# Patient Record
Sex: Female | Born: 1975 | Race: White | Hispanic: No | Smoking: Never smoker
Health system: Southern US, Academic
[De-identification: ages and names within clinical notes are randomized; demographics above are authoritative.]

## PROBLEM LIST (undated history)

## (undated) DIAGNOSIS — I10 Essential (primary) hypertension: Secondary | ICD-10-CM

---

## 2009-05-23 ENCOUNTER — Inpatient Hospital Stay (HOSPITAL_COMMUNITY): Payer: Self-pay | Admitting: Obstetrics & Gynecology

## 2021-11-06 IMAGING — MR MRI SHOULDER LT W/O CONTRAST
6 series · 39 of 40 positions shown · IV contrast (gadolinium)
Comparison: None available.

﻿EXAM:  [id number]   MRI SHOULDER LT W/O CONTRAST
INDICATION: Pain.
TECHNIQUE: Multiplanar multisequential MRI of the left shoulder joint was performed without gadolinium contrast.

[Series 6: T1 · oblique · left · 3.5mm · 0.33mm/px · 7 of 18 slices shown]
[im 1/18]
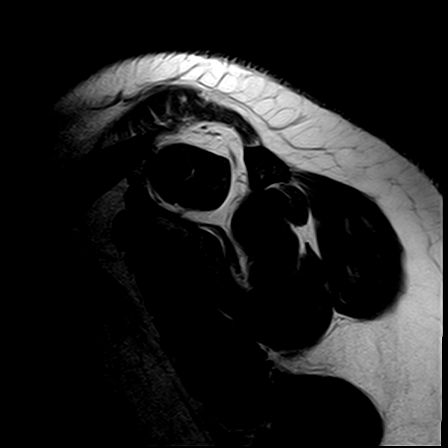
[im 3/18]
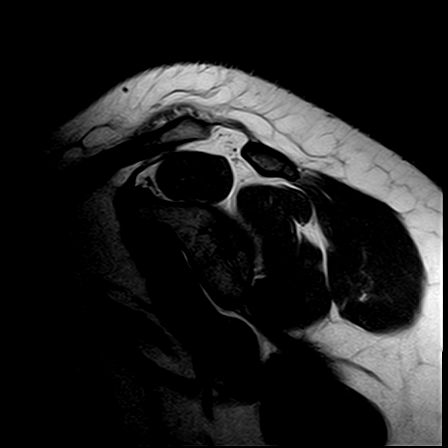
[im 6/18]
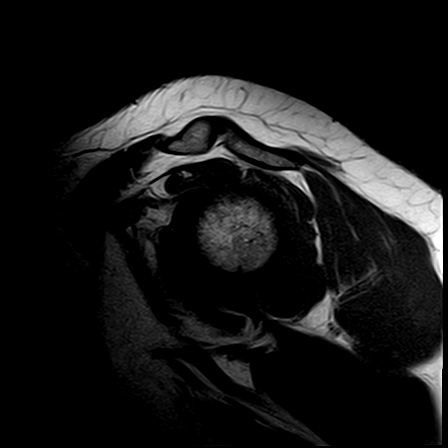
[im 9/18]
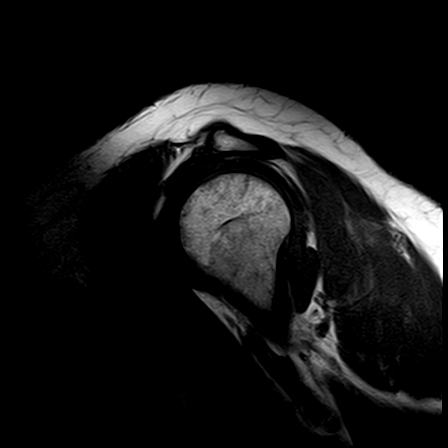
[im 12/18]
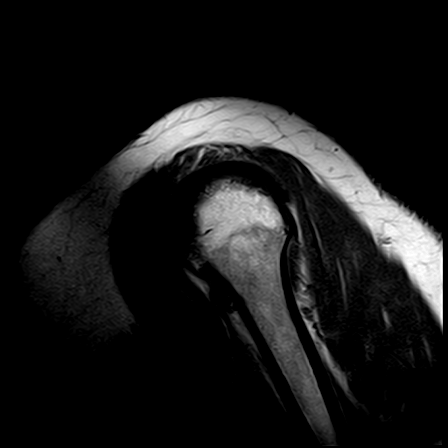
[im 15/18]
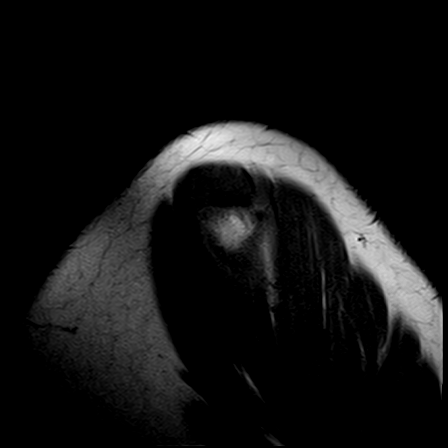
[im 18/18]
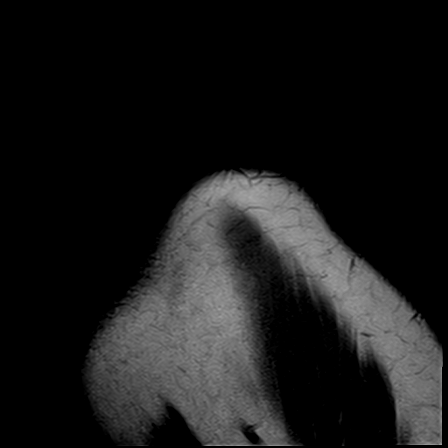

[Series 7: STIR · oblique · left · 3.5mm · 0.47mm/px · 7 of 18 slices shown (1 of 2)]
[im 1/18]
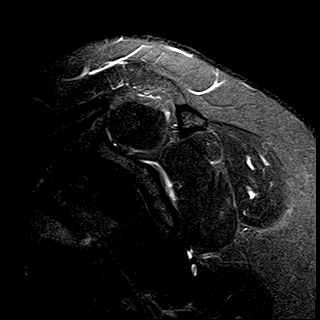
[im 3/18]
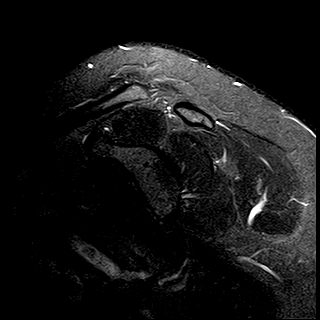
[im 6/18]
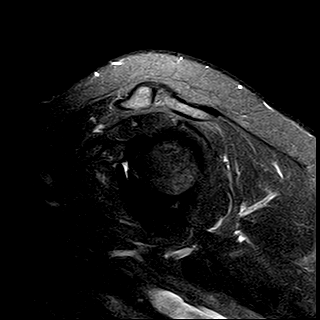
[im 9/18]
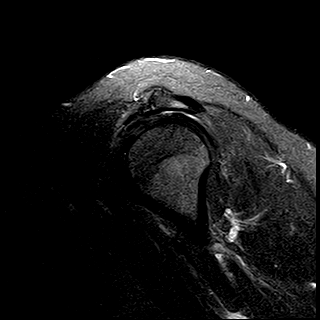
[im 12/18]
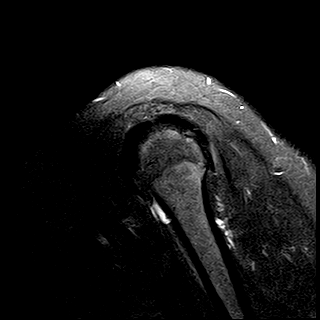
[im 15/18]
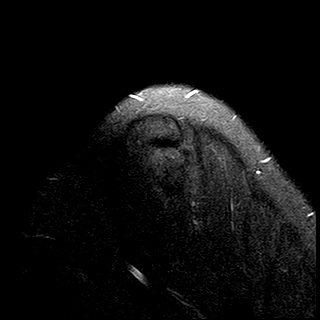
[im 18/18]
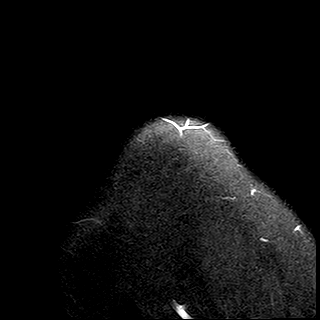

[Series 8: PD fat-sat · axial · left · 4.0mm · 0.50mm/px · z∈[-55,+22]mm · 6 of 18 slices shown (1 of 2)]
[im 1/18]
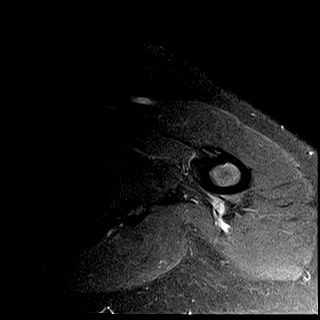
[im 4/18]
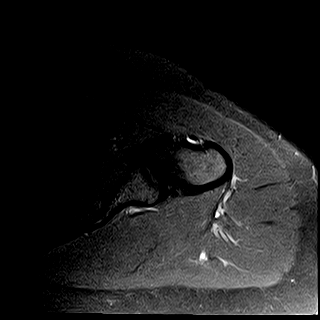
[im 7/18]
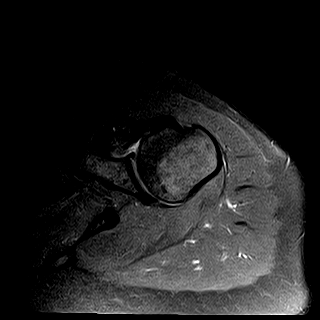
[im 11/18]
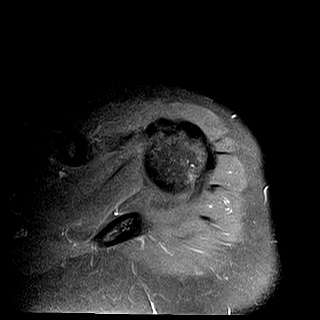
[im 14/18]
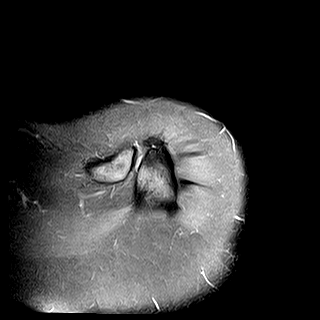
[im 18/18]
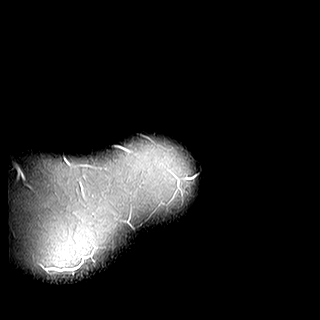

[Series 9: T2 fat-sat · axial · left · 4.0mm · 0.42mm/px · z∈[-68,+35]mm · 8 of 24 slices shown]
[im 1/24]
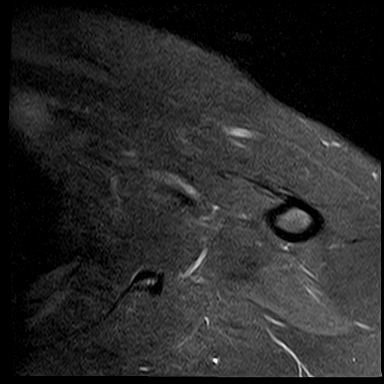
[im 4/24]
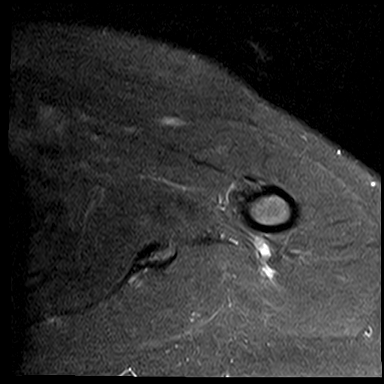
[im 7/24]
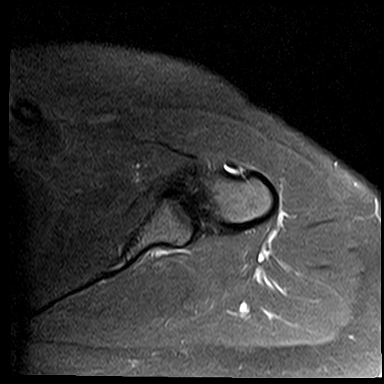
[im 10/24]
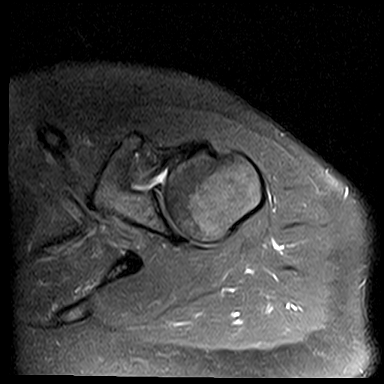
[im 14/24]
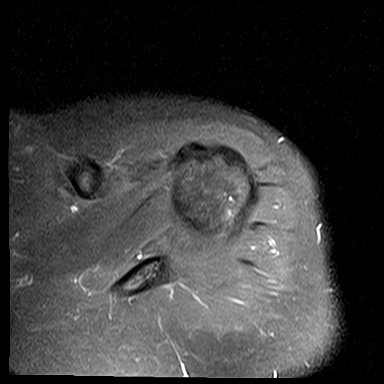
[im 17/24]
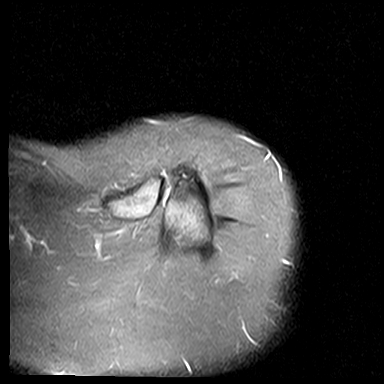
[im 20/24]
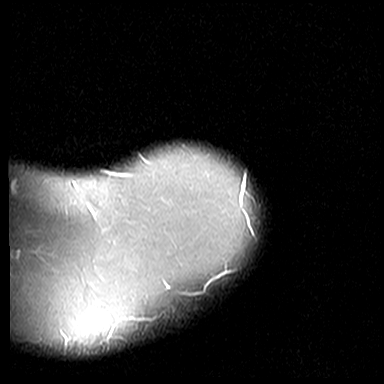
[im 24/24]
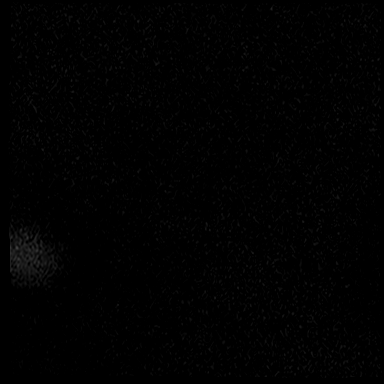

[Series 10: PD fat-sat · oblique · left · 3.5mm · 0.47mm/px · 6 of 18 slices shown (2 of 2)]
[im 1/18]
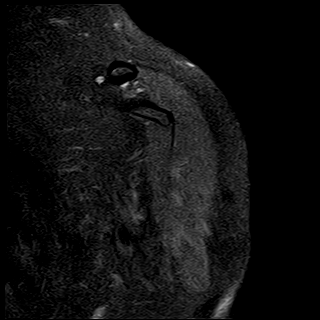
[im 4/18]
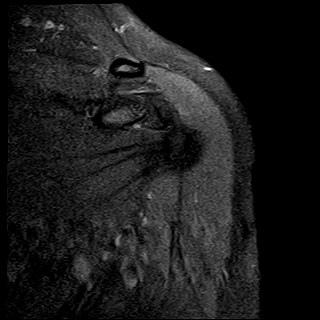
[im 7/18]
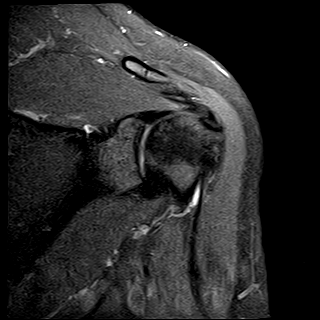
[im 11/18]
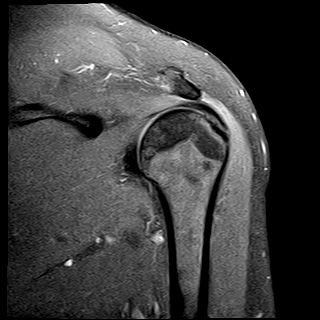
[im 14/18]
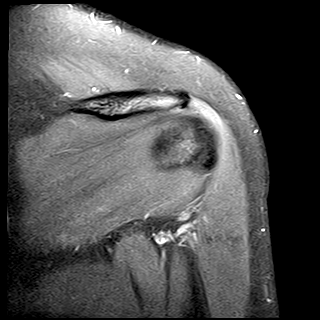
[im 18/18]
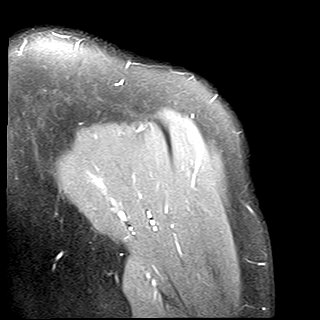

[Series 11: STIR · oblique · left · 3.5mm · 0.47mm/px · 5 of 18 slices shown (2 of 2)]
[im 1/18]
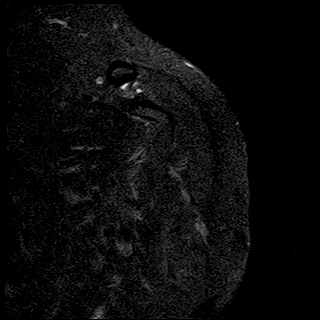
[im 4/18]
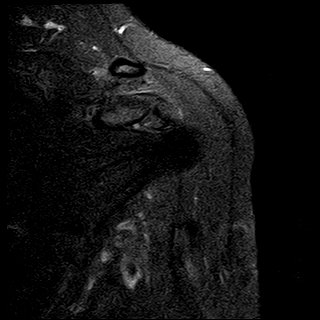
[im 7/18]
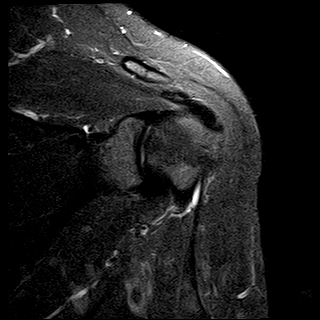
[im 11/18]
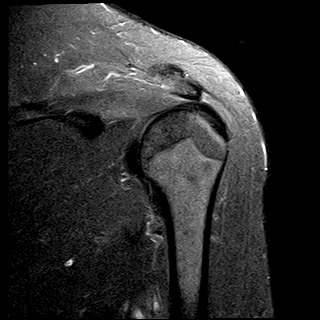
[im 14/18]
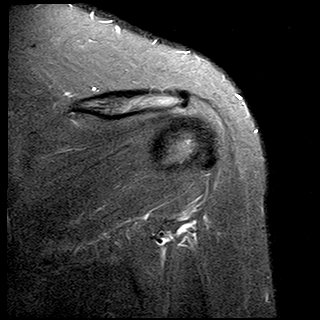

[39 of 40 positions shown; findings below may reference images not displayed]

FINDINGS: Supraspinatus, infraspinatus, teres minor and subscapularis muscles and tendons are within normal limits in morphology and signal intensity. Long head of biceps tendon is also well seated within the intertubercular groove and attaches normally to the biceps anchor. Superior labrum is intact. Glenohumeral articular cartilage is well maintained. Acromioclavicular joint is unremarkable.  No significant fluid is noted within the subacromial/subdeltoid bursa. Muscle bulk and bone marrow signal intensity are normal. No mass is seen along the course of the suprascapular nerve, within the spinoglenoid notch or within the quadrilateral space.
IMPRESSION: Unremarkable exam.

## 2022-01-24 IMAGING — MR MRI SHOUDLER LT W WO CONTRAST
4 of 6 series · 23 of 40 positions shown · IV contrast (Gadavist)
Comparison: Noncontrast MRI dated 11/06/2021.

﻿EXAM:  [id number]   MRI SHOUDLER LT W WO CONTRAST
INDICATION: Pain and decreased range of motion.
TECHNIQUE: Axial, sagittal and coronal T1 fat sat sequences were obtained without and with 5 mL of Gadavist.

[Series 8: T1 · oblique · left · 3.5mm · 0.31mm/px · 7 of 22 slices shown]
[im 1/22]
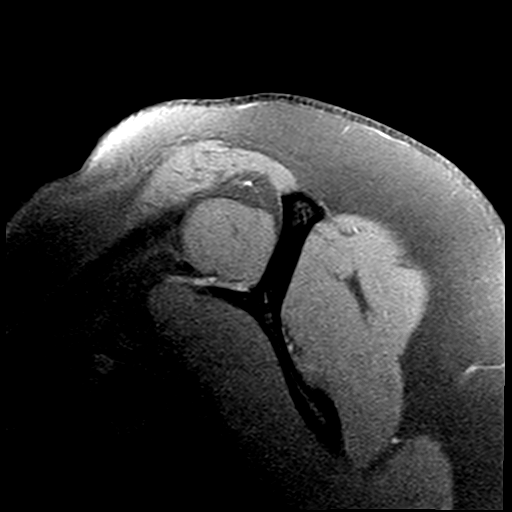
[im 4/22]
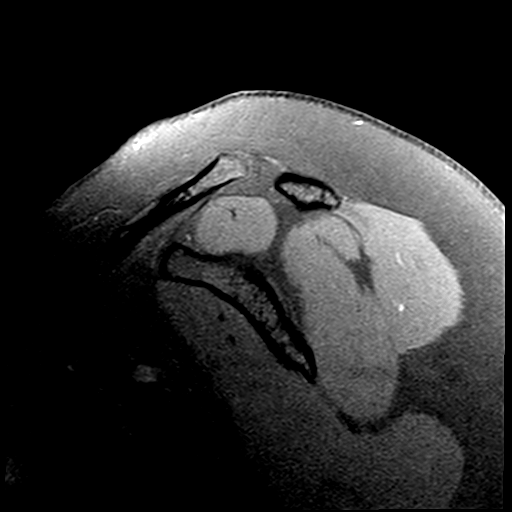
[im 8/22]
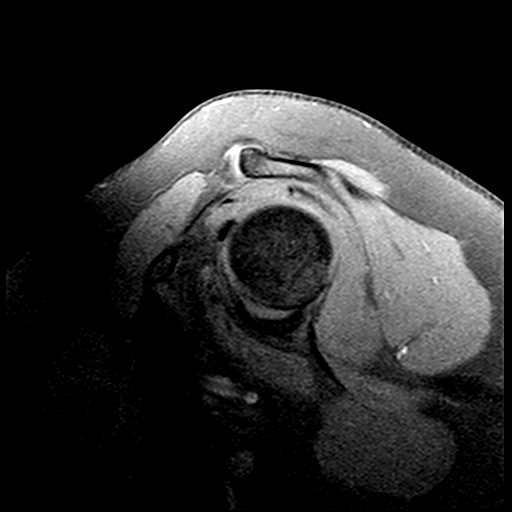
[im 11/22]
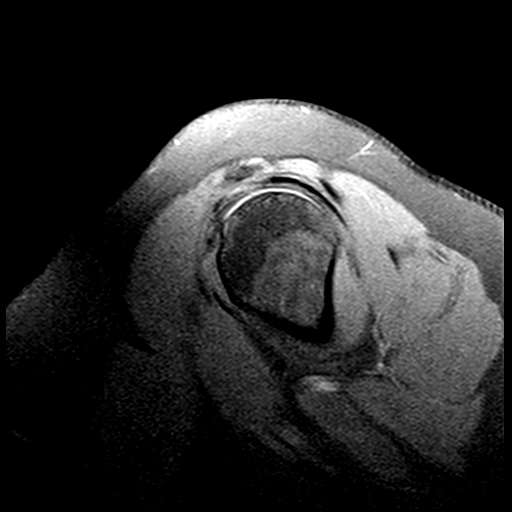
[im 15/22]
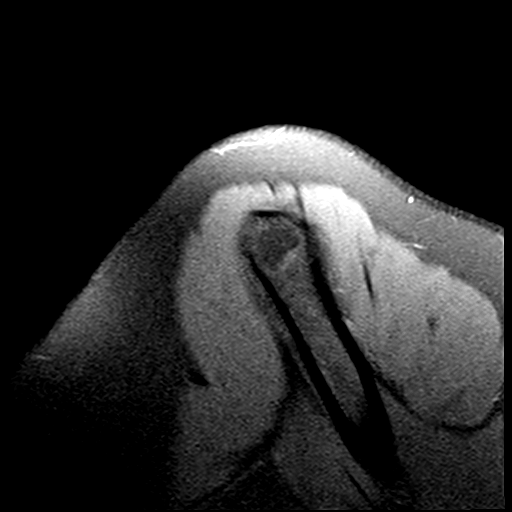
[im 18/22]
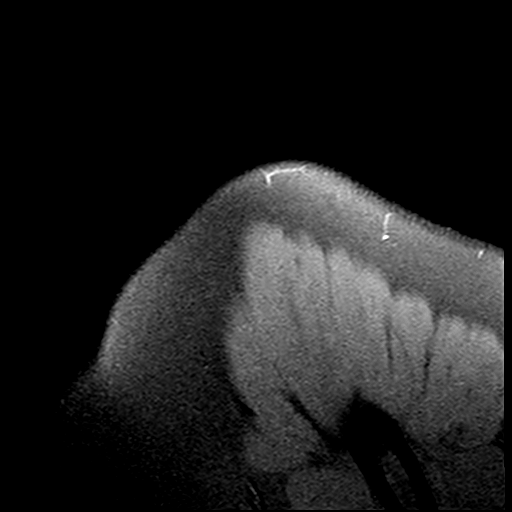
[im 22/22]
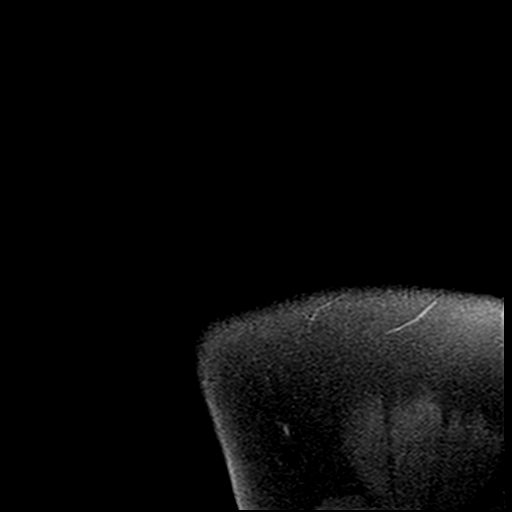

[Series 9: T1 fat-sat · axial · left · 4.0mm · 0.50mm/px · z∈[-70,+14]mm · 6 of 18 slices shown (1 of 2)]
[im 1/18]
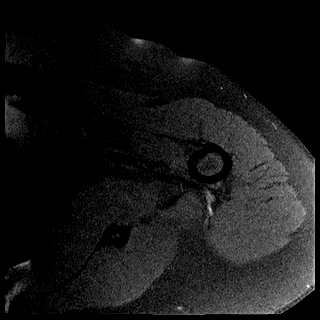
[im 4/18]
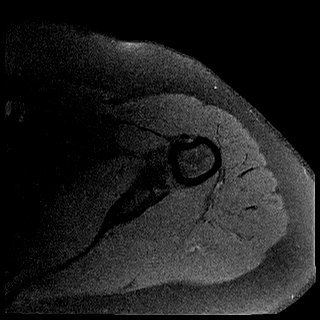
[im 7/18]
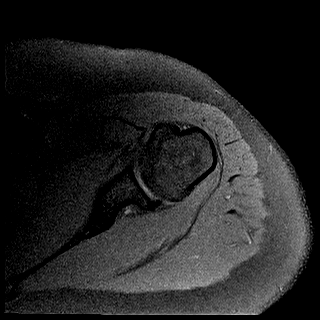
[im 11/18]
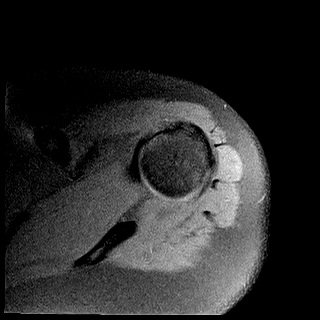
[im 14/18]
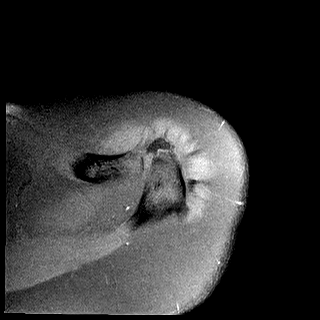
[im 18/18]
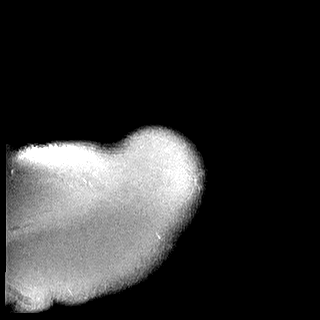

[Series 10: T1 fat-sat · coronal · left · 3.5mm · 0.31mm/px · 7 of 20 slices shown (2 of 2)]
[im 1/20]
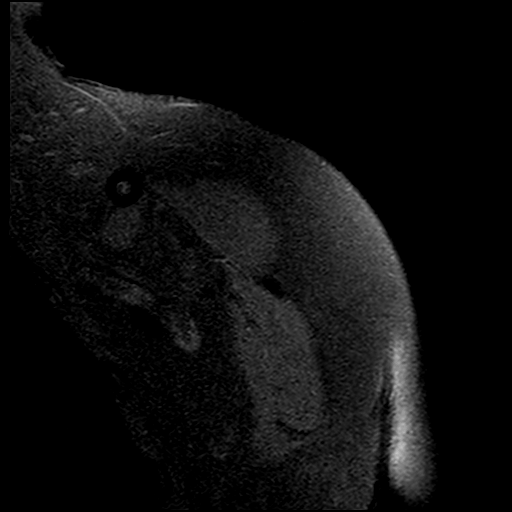
[im 4/20]
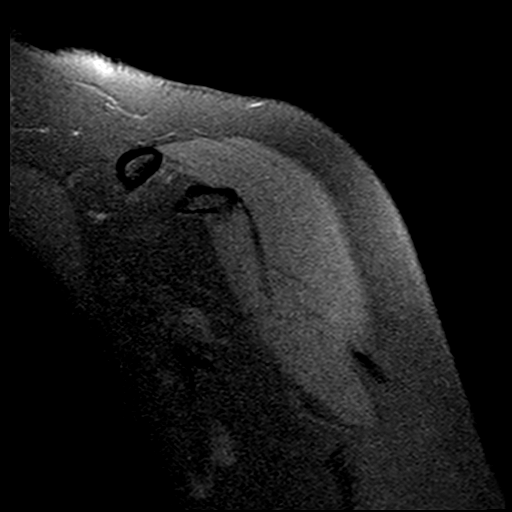
[im 7/20]
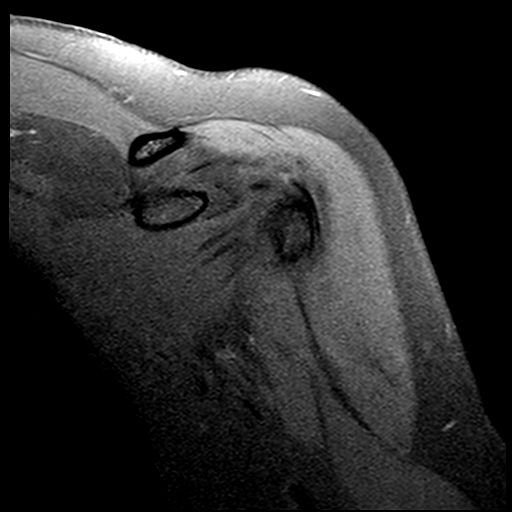
[im 10/20]
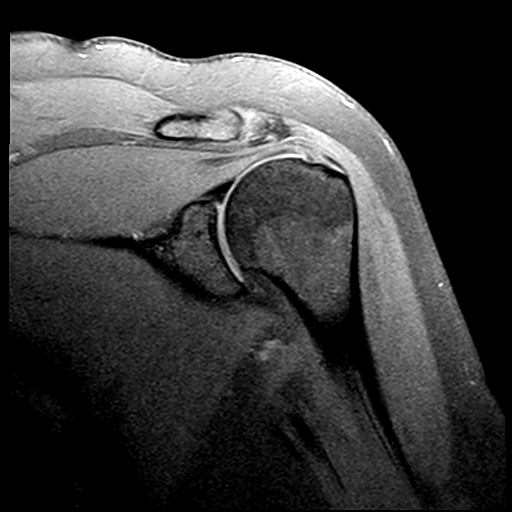
[im 13/20]
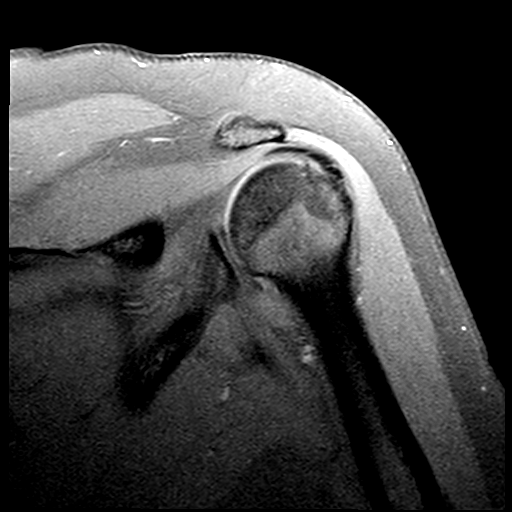
[im 16/20]
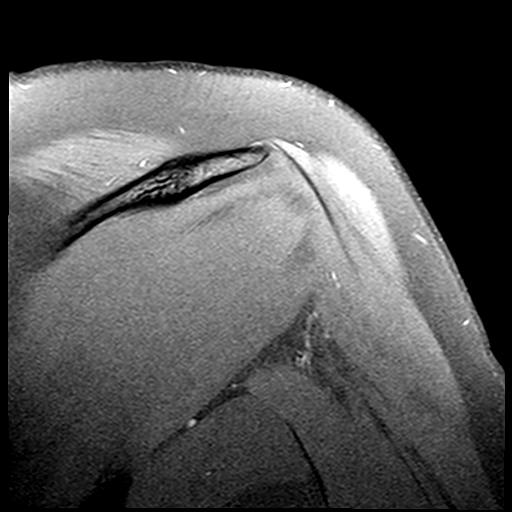
[im 20/20]
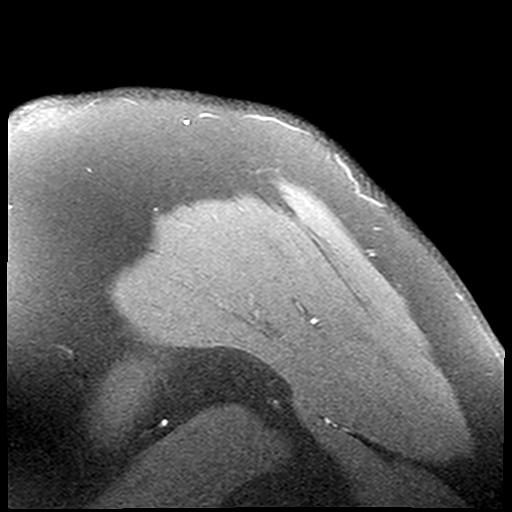

[Series 11: T1 fat-sat post-contrast · oblique · left · 3.5mm · 0.31mm/px · 3 of 22 slices shown]
[im 4/22]
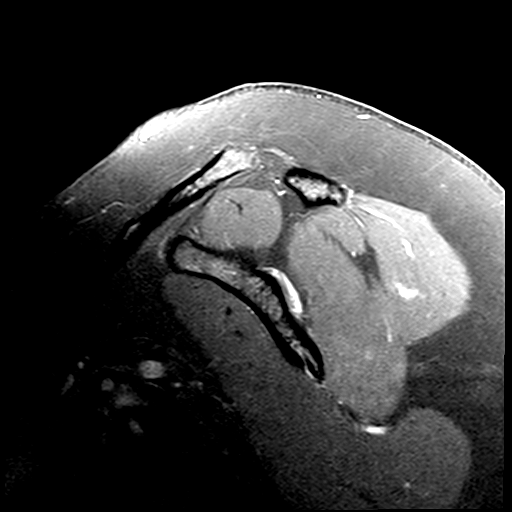
[im 11/22]
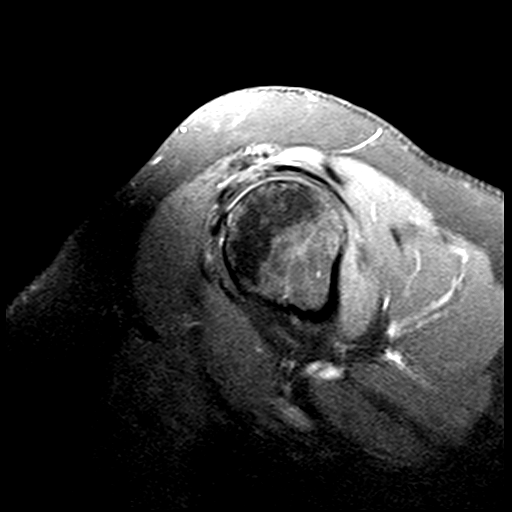
[im 18/22]
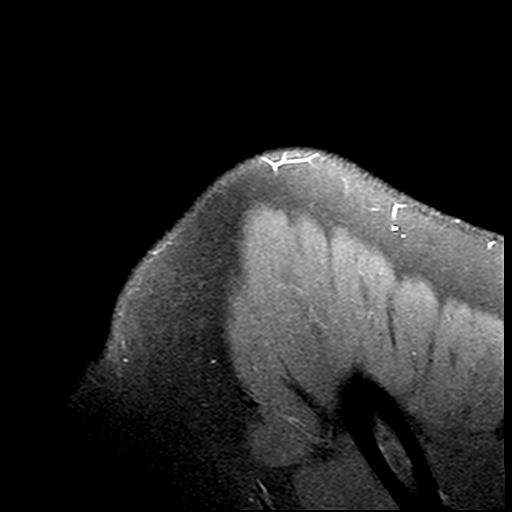

[23 of 40 positions shown; findings below may reference images not displayed]

FINDINGS: There is no abnormal enhancement.  Glenohumeral and acromioclavicular joints appear unremarkable.
IMPRESSION: Unremarkable exam.

## 2022-11-08 ENCOUNTER — Other Ambulatory Visit (HOSPITAL_COMMUNITY): Payer: Self-pay

## 2022-11-08 DIAGNOSIS — G5692 Unspecified mononeuropathy of left upper limb: Secondary | ICD-10-CM

## 2022-11-08 DIAGNOSIS — M5412 Radiculopathy, cervical region: Secondary | ICD-10-CM

## 2022-11-17 ENCOUNTER — Ambulatory Visit (HOSPITAL_COMMUNITY): Payer: Self-pay

## 2022-11-25 IMAGING — MR MRI CHEST WITHOUT CONTRAST
12 series · 16 of 16 positions shown · non-contrast
Comparison: None available.

﻿EXAM:  [id number]   MRI CHEST WITHOUT CONTRAST
INDICATION: Entrapment neuropathy left brachial plexus area.  Left shoulder pain/neck pain, numbness in fingers left side, unable to raise arm, hurt shoulder lifting heavy object at work recently.
TECHNIQUE: Noncontrast multiplanar, multisequence MRI was performed with attention directed to the area of interest in the region of the left brachial plexus.

[Series 4: shim axial · axial · 10.0mm · 3.12mm/px · z∈[-90,+150]mm · 2 of 25 slices shown (1 of 2)]
[im 1/25]
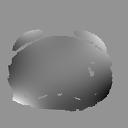
[im 25/25]
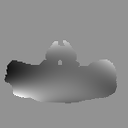

[Series 5: shim axial · axial · 10.0mm · 3.12mm/px · 1 of 25 slices shown (2 of 2)]
[im 1/25]
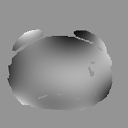

[Series 6: s-map axial bh · axial · 14.1mm · 7.03mm/px · z∈[-259,+291]mm · 4 of 80 slices shown]
[im 1/80]
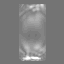
[im 27/80]
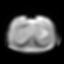
[im 53/80]
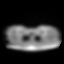
[im 80/80]
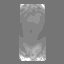

[Series 7: T1 · coronal · 7.5mm · 1.96mm/px · 1 of 24 slices shown (1 of 2)]
[im 1/24]
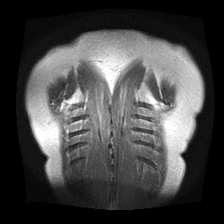

[Series 8: T2 · coronal · 7.0mm · 1.72mm/px · 1 of 24 slices shown (1 of 2)]
[im 1/24]
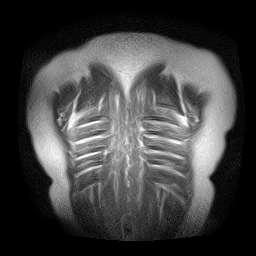

[Series 9: T2 · axial · 7.0mm · 1.56mm/px · 1 of 24 slices shown (2 of 2)]
[im 1/24]
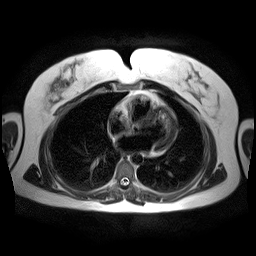

[Series 10: axial in/out phase · axial · 8.0mm · 1.72mm/px · 1 of 24 slices shown (1 of 2)]
[im 1/24]
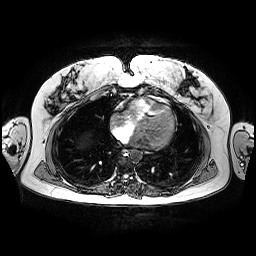

[Series 11: axial in/out phase · axial · 8.0mm · 1.72mm/px · 1 of 24 slices shown (2 of 2)]
[im 1/24]
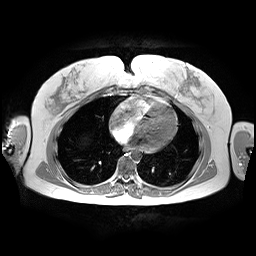

[Series 12: T1 · axial · 8.0mm · 1.72mm/px · 1 of 24 slices shown (2 of 2)]
[im 1/24]
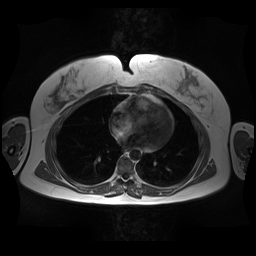

[Series 13: T1 fat-sat · axial · 8.0mm · 1.72mm/px · 1 of 24 slices shown]
[im 1/24]
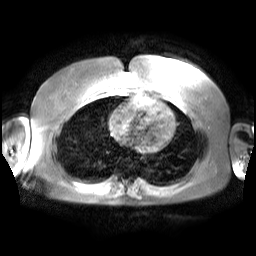

[Series 14: STIR · axial · 8.0mm · 1.56mm/px · 1 of 24 slices shown (1 of 2)]
[im 1/24]
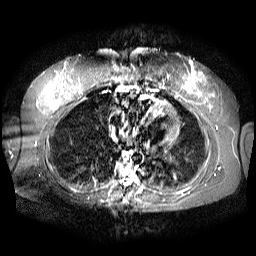

[Series 15: STIR · axial · 8.0mm · 1.56mm/px · 1 of 24 slices shown (2 of 2)]
[im 1/24]
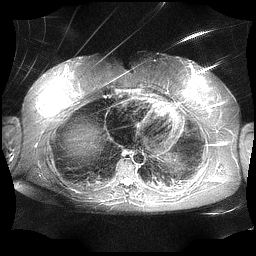

[16 of 16 positions shown; findings below may reference images not displayed]

FINDINGS: There is no mass, cyst, or abnormal fluid collection.  There is no significant marrow signal alteration.  Lung evaluation is limited by artifact.  There is no apparent lymph node enlargement.
IMPRESSION: Unremarkable examination.

## 2022-11-25 IMAGING — MR MRI CERVICAL SPINE WITHOUT CONTRAST
6 series · 29 of 48 positions shown · non-contrast
Comparison: None available.

﻿EXAM:  [id number]   MRI CERVICAL SPINE WITHOUT CONTRAST
INDICATION: Left shoulder pain/neck pain.  Radiculopathy.  Numbness in fingers left side, unable to raise arm, hurt shoulder lifting heavy object at work recently.
TECHNIQUE: Noncontrast multiplanar, multisequence MRI was performed.

[Series 4: s-map · sagittal · 8.8mm · 4.38mm/px · 8 of 100 slices shown]
[im 4/100]
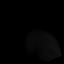
[im 16/100]
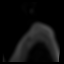
[im 32/100]
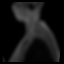
[im 44/100]
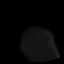
[im 56/100]
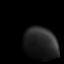
[im 68/100]
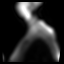
[im 84/100]
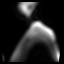
[im 96/100]
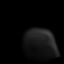

[Series 5: T2 · sagittal · 3.0mm · 0.75mm/px · 4 of 13 slices shown (1 of 2)]
[im 1/13]
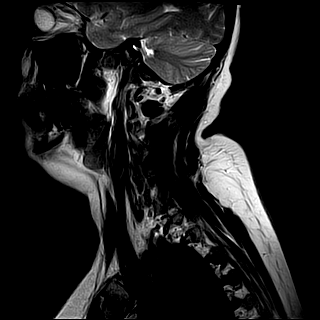
[im 5/13]
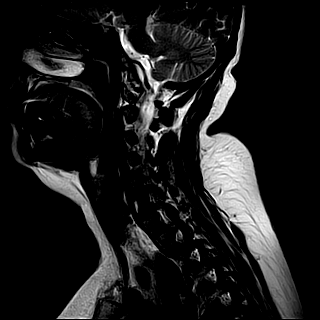
[im 9/13]
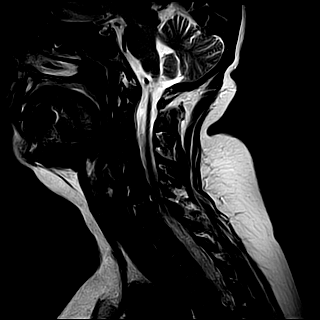
[im 13/13]
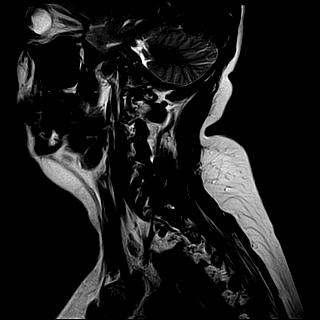

[Series 6: T1 · sagittal · 3.0mm · 0.47mm/px · 4 of 13 slices shown]
[im 1/13]
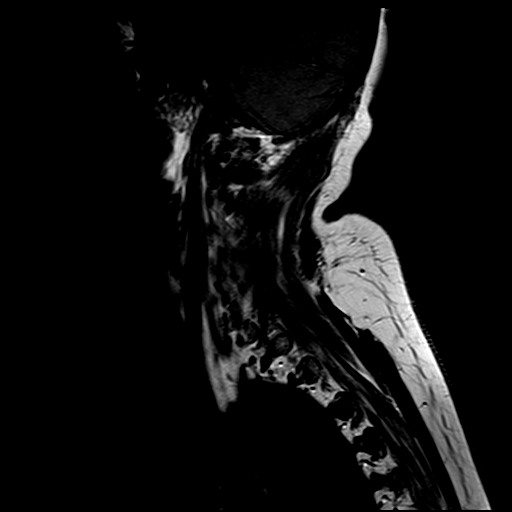
[im 5/13]
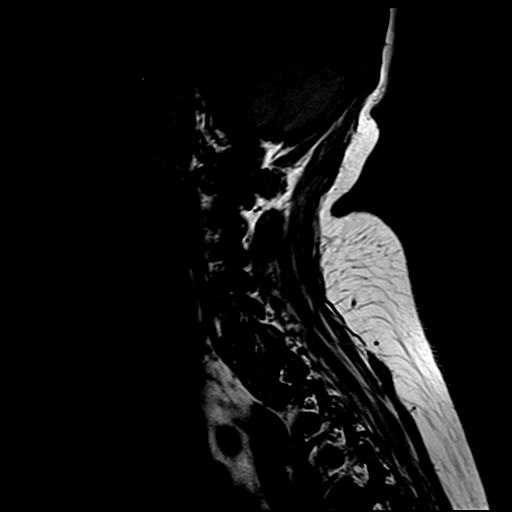
[im 9/13]
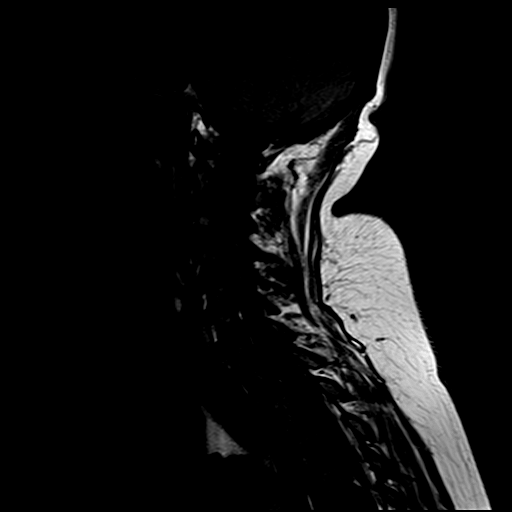
[im 13/13]
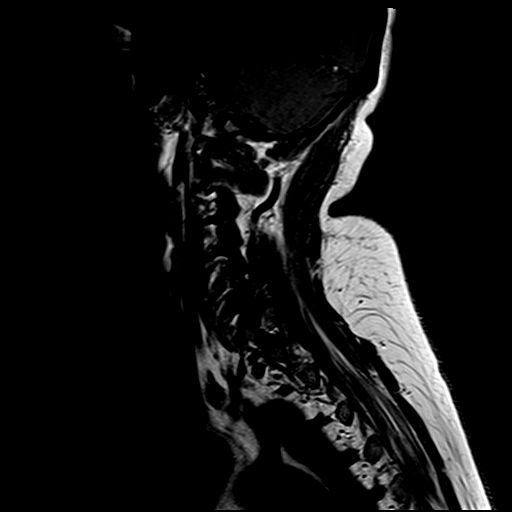

[Series 7: STIR · sagittal · 3.0mm · 0.47mm/px · 4 of 13 slices shown]
[im 1/13]
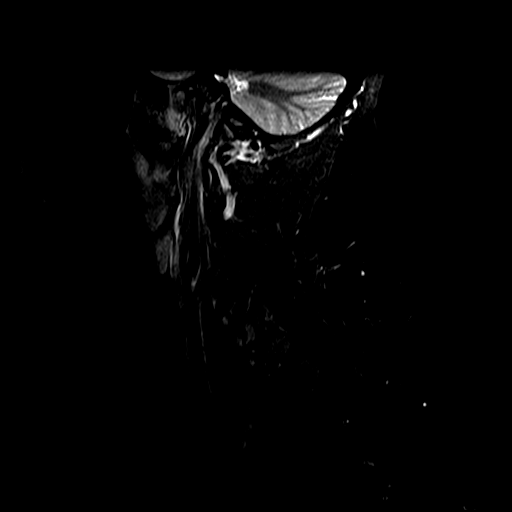
[im 5/13]
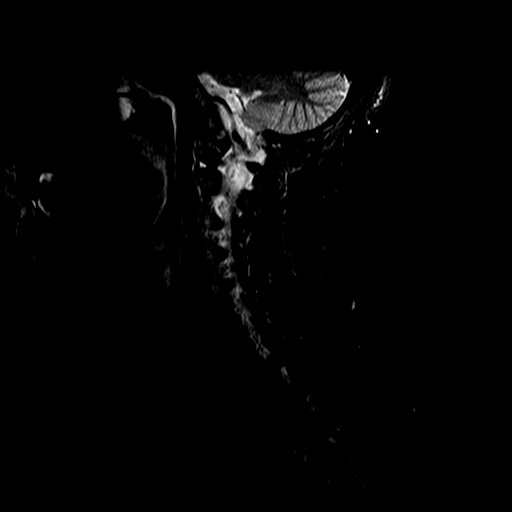
[im 9/13]
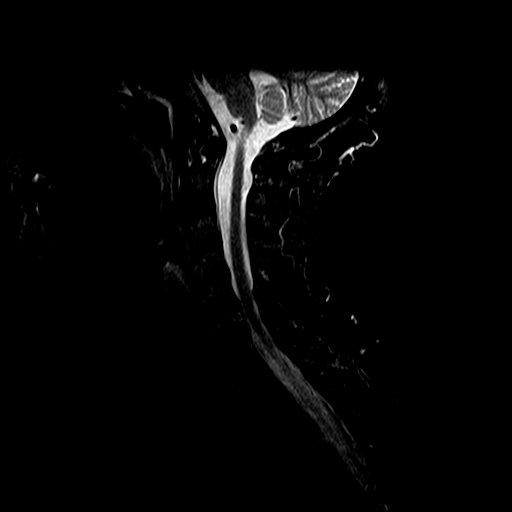
[im 13/13]
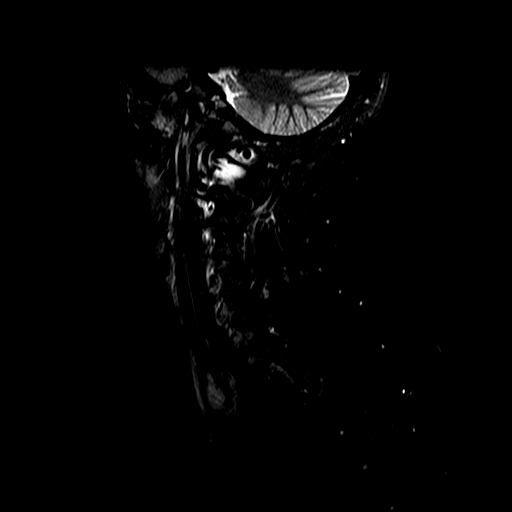

[Series 8: T2 · axial · 3.0mm · 0.39mm/px · z∈[-73,+25]mm · 5 of 18 slices shown (2 of 2)]
[im 1/18]
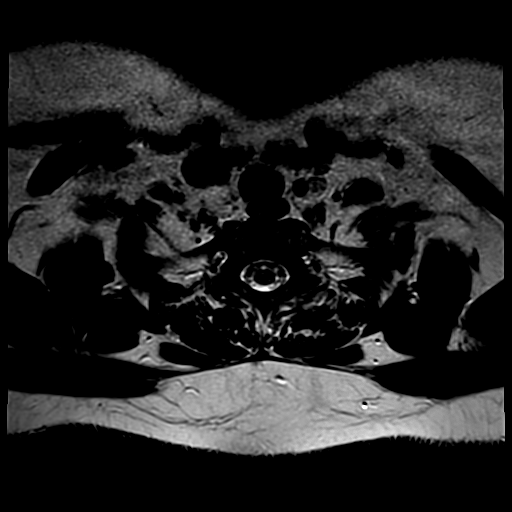
[im 5/18]
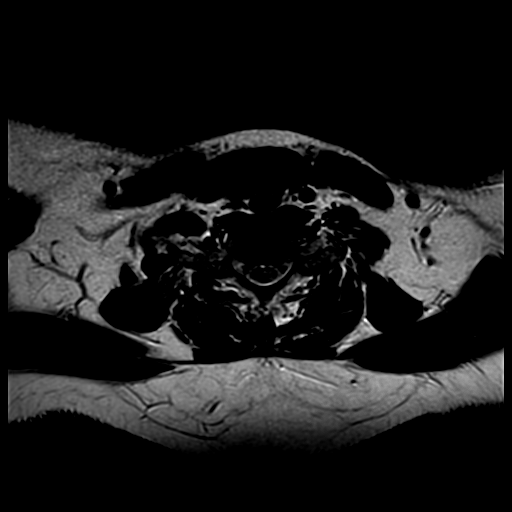
[im 9/18]
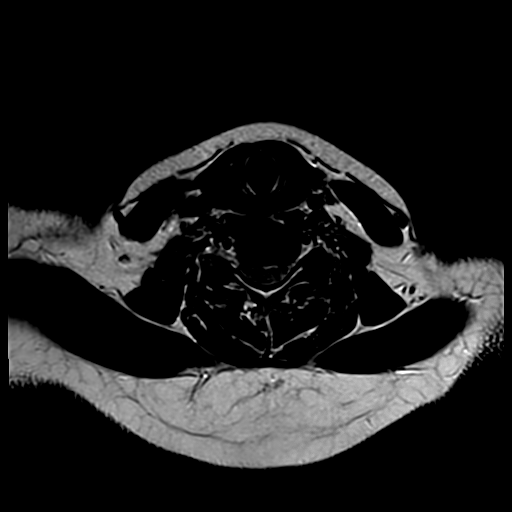
[im 13/18]
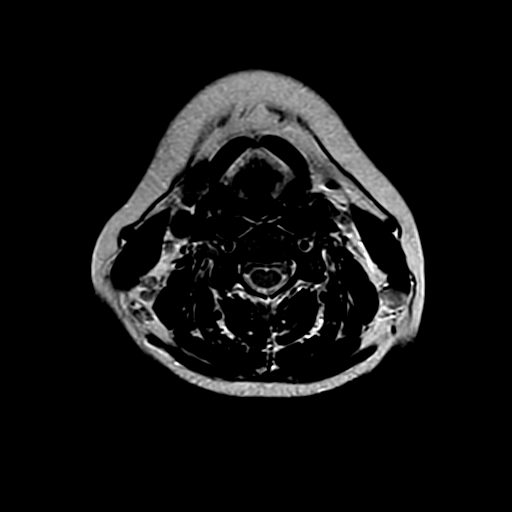
[im 18/18]
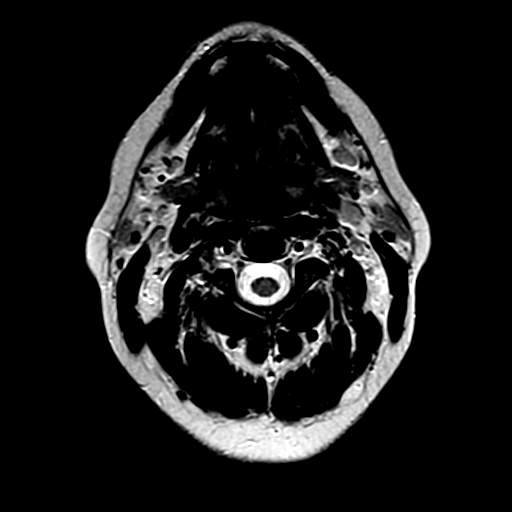

[Series 9: T2-star · axial · 3.0mm · 0.39mm/px · z∈[-73,-0]mm · 4 of 18 slices shown]
[im 1/18]
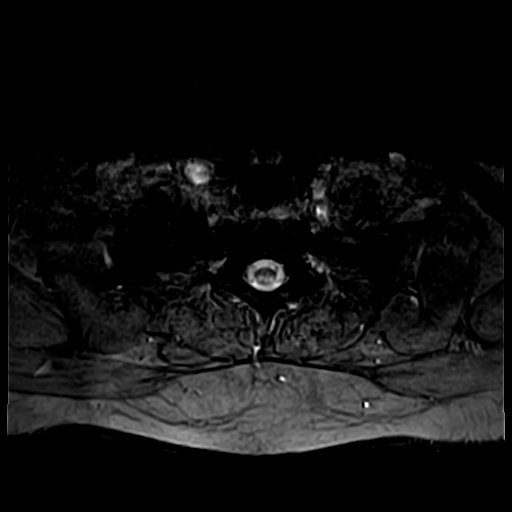
[im 5/18]
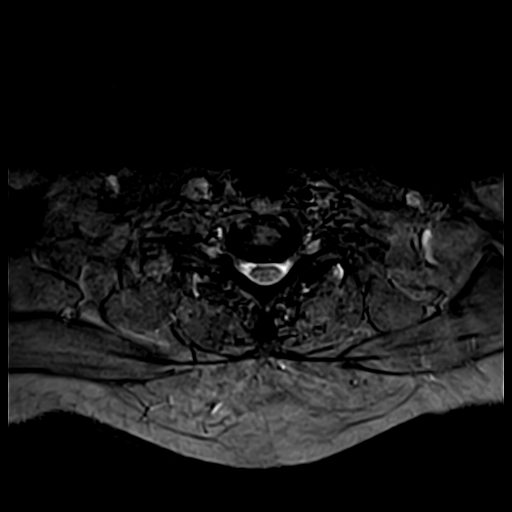
[im 9/18]
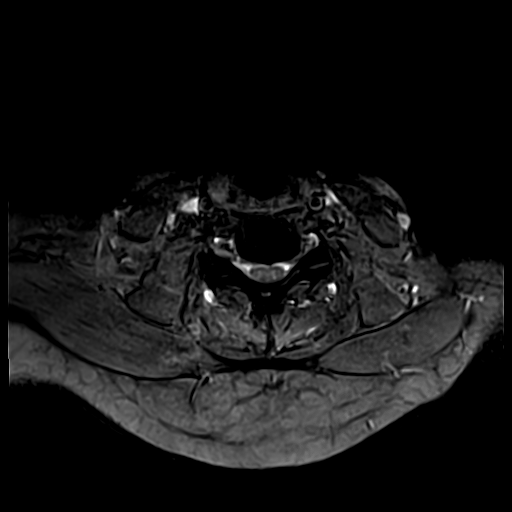
[im 13/18]
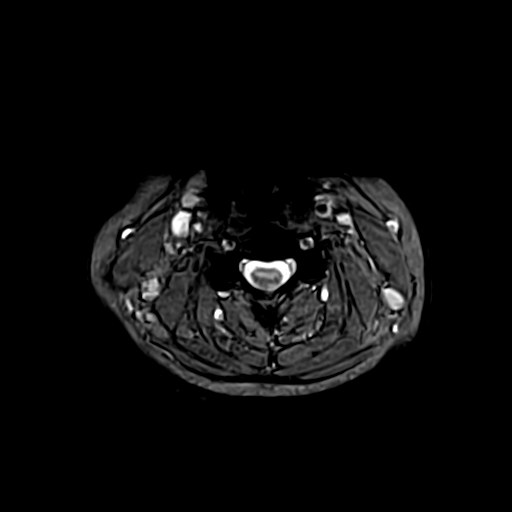

[29 of 48 positions shown; findings below may reference images not displayed]

FINDINGS: There are mild to moderate degenerative changes at C5-C6 and C6-C7.  There are small disc-osteophyte complexes at C5-C6 and C6-C7 with slight compression of the thecal sac but no apparent spinal cord compression.  No frank disc herniation is seen.  

There is moderate narrowing of the left C6-C7 neural foramen.  The remaining visualized neural foramina appear patent.  

There is no fracture, malalignment, significant marrow signal alteration, abnormality of the spinal cord, or Chiari malformation.  There is no significant spinal stenosis.
IMPRESSION: 1. Mild to moderate degenerative changes with small disc-osteophyte complexes at C5-C6 and C6-C7.  

2. Moderate left C6-C7 neural foraminal narrowing.

## 2023-01-25 ENCOUNTER — Emergency Department (HOSPITAL_BASED_OUTPATIENT_CLINIC_OR_DEPARTMENT_OTHER): Payer: 59

## 2023-01-25 ENCOUNTER — Encounter (HOSPITAL_BASED_OUTPATIENT_CLINIC_OR_DEPARTMENT_OTHER): Payer: Self-pay

## 2023-01-25 ENCOUNTER — Emergency Department
Admission: EM | Admit: 2023-01-25 | Discharge: 2023-01-26 | Disposition: A | Discharge status: Home / Self Care | Payer: 59 | Attending: Emergency Medicine | Admitting: Emergency Medicine

## 2023-01-25 ENCOUNTER — Other Ambulatory Visit: Payer: Self-pay

## 2023-01-25 DIAGNOSIS — J209 Acute bronchitis, unspecified: Secondary | ICD-10-CM | POA: Insufficient documentation

## 2023-01-25 DIAGNOSIS — R509 Fever, unspecified: Secondary | ICD-10-CM

## 2023-01-25 DIAGNOSIS — B349 Viral infection, unspecified: Secondary | ICD-10-CM | POA: Insufficient documentation

## 2023-01-25 DIAGNOSIS — Z1152 Encounter for screening for COVID-19: Secondary | ICD-10-CM | POA: Insufficient documentation

## 2023-01-25 LAB — URINALYSIS, MACRO/MICRO
BILIRUBIN: NEGATIVE mg/dL
GLUCOSE: NEGATIVE mg/dL
KETONES: NEGATIVE mg/dL
LEUKOCYTES: NEGATIVE WBCs/uL
NITRITE: NEGATIVE
PH: 6 (ref 4.6–8.0)
PROTEIN: NEGATIVE mg/dL
SPECIFIC GRAVITY: 1.015 (ref 1.003–1.035)
UROBILINOGEN: 0.2 mg/dL (ref 0.2–1.0)

## 2023-01-25 LAB — COMPREHENSIVE METABOLIC PANEL, NON-FASTING
ALBUMIN/GLOBULIN RATIO: 1 (ref 0.8–1.4)
ALBUMIN: 3.9 g/dL (ref 3.4–5.0)
ALKALINE PHOSPHATASE: 87 U/L (ref 46–116)
ALT (SGPT): 45 U/L (ref <=78)
ANION GAP: 7 mmol/L (ref 4–13)
AST (SGOT): 37 U/L (ref 15–37)
BILIRUBIN TOTAL: 0.5 mg/dL (ref 0.2–1.0)
BUN/CREA RATIO: 11
BUN: 8 mg/dL (ref 7–18)
CALCIUM, CORRECTED: 9.2 mg/dL
CALCIUM: 9.1 mg/dL (ref 8.5–10.1)
CHLORIDE: 102 mmol/L (ref 98–107)
CO2 TOTAL: 26 mmol/L (ref 21–32)
CREATININE: 0.74 mg/dL (ref 0.55–1.02)
ESTIMATED GFR: 102 mL/min/{1.73_m2} (ref >59)
GLOBULIN: 3.9
GLUCOSE: 103 mg/dL (ref 74–106)
OSMOLALITY, CALCULATED: 269 mOsm/kg — ABNORMAL LOW (ref 270–290)
POTASSIUM: 3.5 mmol/L (ref 3.5–5.1)
PROTEIN TOTAL: 7.8 g/dL (ref 6.4–8.2)
SODIUM: 135 mmol/L — ABNORMAL LOW (ref 136–145)

## 2023-01-25 LAB — CBC WITH DIFF
BASOPHIL #: 0.01 10*3/uL (ref 0.00–0.30)
BASOPHIL %: 0 % (ref 0–3)
EOSINOPHIL #: 0.29 10*3/uL (ref 0.00–0.80)
EOSINOPHIL %: 3 % (ref 0–7)
HCT: 40.4 % (ref 37.0–47.0)
HGB: 13.7 g/dL (ref 12.5–16.0)
LYMPHOCYTE #: 1.44 10*3/uL (ref 1.10–5.00)
LYMPHOCYTE %: 15 % — ABNORMAL LOW (ref 25–45)
MCH: 32.3 pg — ABNORMAL HIGH (ref 27.0–32.0)
MCHC: 33.8 g/dL (ref 32.0–36.0)
MCV: 95.6 fL (ref 78.0–99.0)
MONOCYTE #: 0.58 10*3/uL (ref 0.00–1.30)
MONOCYTE %: 6 % (ref 0–12)
MPV: 9.1 fL (ref 7.4–10.4)
NEUTROPHIL #: 7.27 10*3/uL (ref 1.80–8.40)
NEUTROPHIL %: 76 % (ref 40–76)
PLATELETS: 218 10*3/uL (ref 140–440)
RBC: 4.23 10*6/uL (ref 4.20–5.40)
RDW: 14 % (ref 11.6–14.8)
WBC: 9.6 10*3/uL (ref 4.0–10.5)

## 2023-01-25 LAB — RAPID THROAT SCREEN, STREPTOCOCCUS, WITH REFLEX: THROAT RAPID SCREEN, STREPTOCOCCUS: NEGATIVE

## 2023-01-25 LAB — COVID-19, FLU A/B, RSV RAPID BY PCR
INFLUENZA VIRUS TYPE A: NOT DETECTED
INFLUENZA VIRUS TYPE B: NOT DETECTED
RESPIRATORY SYNCTIAL VIRUS (RSV): NOT DETECTED
SARS-CoV-2: NOT DETECTED

## 2023-01-25 LAB — LACTIC ACID LEVEL W/ REFLEX FOR LEVEL >2.0: LACTIC ACID: 1.7 mmol/L (ref 0.4–2.0)

## 2023-01-25 MED ORDER — SODIUM CHLORIDE 0.9 % IV BOLUS
1000.0000 mL | INJECTION | Status: AC
Start: 2023-01-25 — End: 2023-01-26
  Administered 2023-01-25: 1000 mL via INTRAVENOUS
  Administered 2023-01-26: 0 mL via INTRAVENOUS

## 2023-01-25 MED ORDER — ALBUTEROL SULFATE HFA 90 MCG/ACTUATION AEROSOL INHALER
INHALATION_SPRAY | RESPIRATORY_TRACT | Status: AC
Start: 2023-01-25 — End: 2023-01-25
  Filled 2023-01-25: qty 8.5

## 2023-01-25 MED ORDER — DEXAMETHASONE SODIUM PHOSPHATE (PF) 10 MG/ML INJECTION SOLUTION
INTRAMUSCULAR | Status: AC
Start: 2023-01-25 — End: 2023-01-25
  Filled 2023-01-25: qty 1

## 2023-01-25 MED ORDER — IBUPROFEN 800 MG TABLET
ORAL_TABLET | ORAL | Status: AC
Start: 2023-01-25 — End: 2023-01-25
  Filled 2023-01-25: qty 1

## 2023-01-25 MED ORDER — AEROCHAMBER W/ FLOWSIGNAL SPACER
Freq: Once | Status: AC
Start: 2023-01-26 — End: 2023-01-25
  Filled 2023-01-25: qty 1

## 2023-01-25 MED ORDER — ALBUTEROL SULFATE HFA 90 MCG/ACTUATION AEROSOL INHALER
2.0000 | INHALATION_SPRAY | Freq: Four times a day (QID) | RESPIRATORY_TRACT | Status: DC | PRN
Start: 2023-01-25 — End: 2023-01-26
  Administered 2023-01-25: 2 via RESPIRATORY_TRACT

## 2023-01-25 MED ORDER — SODIUM CHLORIDE 0.9 % INTRAVENOUS PIGGYBACK
1.0000 g | INTRAVENOUS | Status: AC
Start: 2023-01-25 — End: 2023-01-26
  Administered 2023-01-25: 1 g via INTRAVENOUS
  Administered 2023-01-26: 0 g via INTRAVENOUS

## 2023-01-25 MED ORDER — IBUPROFEN 800 MG TABLET
800.0000 mg | ORAL_TABLET | ORAL | Status: AC
Start: 2023-01-25 — End: 2023-01-25
  Administered 2023-01-25: 800 mg via ORAL

## 2023-01-25 MED ORDER — DEXAMETHASONE SODIUM PHOSPHATE (PF) 10 MG/ML INJECTION SOLUTION
10.0000 mg | INTRAMUSCULAR | Status: AC
Start: 2023-01-25 — End: 2023-01-25
  Administered 2023-01-25: 10 mg via INTRAVENOUS

## 2023-01-25 MED ORDER — CEFTRIAXONE 1 GRAM SOLUTION FOR INJECTION
INTRAMUSCULAR | Status: AC
Start: 2023-01-25 — End: 2023-01-25
  Filled 2023-01-25: qty 10

## 2023-01-25 NOTE — ED Provider Notes (Signed)
[facility], [facility] Emergency Department  ED Primary Provider Note  History of Present Illness   Chief Complaint   Patient presents with    Flu Like Symptoms     Arrival: The patient arrived by [employer] Hinojosa is a 47 y.o. female who had concerns including Flu Like Symptoms. Pt states she has been congestion for 3 days and now having hard time breathing nauseated weak. Feels like she needs to cough something up no voice at times.   Review of Systems   Constitutional: + fever, chills  weakness   Skin: No rash or diaphoresis  HENT: No headaches, + sore throat. congestion  Eyes: No vision changes or photophobia   Cardio: No chest pain, palpitations or leg swelling   Respiratory: + cough, wheezing  SOB  GI:  No nausea, vomiting or stool changes  GU:  No dysuria, hematuria, or increased frequency  MSK: No muscle aches, joint or back pain  Neuro: No seizures, LOC, numbness, tingling, or focal weakness  Psychiatric: No depression, SI or substance abuse  All other systems reviewed and are negative.    History Reviewed This Encounter: all noted and reviewed  Physical Exam   ED Triage Vitals [01/25/23 2211]   BP (Non-Invasive) (!) 178/84   Heart Rate (!) 120   Respiratory Rate 20   Temperature (!) 38.9 C (102.1 F)   SpO2 94 %   Weight 77.1 kg (170 lb)   Height 1.6 m (5\' 3" )     Constitutional:  47 y.o. female who appears in no distress. Normal color, no cyanosis.   HENT:   Head: Normocephalic and atraumatic.   Mouth/Throat: Oropharynx is red post nasal DC voice changes and dry.   Eyes: EOMI, PERRL   Neck: Trachea midline. Neck supple.  Cardiovascular: RRR, No murmurs, rubs or gallops. Intact distal pulses.  Pulmonary/Chest: BS equal bilaterally. No respiratory distress. Scattered wheezes, no  rales or chest tenderness.   Abdominal: Bowel sounds present and normal. Abdomen soft, no tenderness, no rebound and no guarding.  Back: No midline spinal tenderness, no paraspinal tenderness,  no CVA tenderness.           Musculoskeletal: No edema, tenderness or deformity.  Skin: warm and dry. No rash, erythema, pallor or cyanosis  Psychiatric: normal mood and affect. Behavior is normal.   Neurological: Patient keenly alert and responsive, easily able to raise eyebrows, facial muscles/expressions symmetric, speaking in fluent sentences, moving all extremities equally and fully, normal gait  Patient Data     Labs Ordered/Reviewed   RAPID THROAT SCREEN, STREPTOCOCCUS, WITH REFLEX - Normal    Narrative:     Walk-Away Mode   ADULT ROUTINE BLOOD CULTURE, SET OF 2 BOTTLES (BACTERIA AND YEAST)   ADULT ROUTINE BLOOD CULTURE, SET OF 2 BOTTLES (BACTERIA AND YEAST)   THROAT CULTURE, BETA HEMOLYTIC STREPTOCOCCUS   COVID-19, FLU A/B, RSV RAPID BY PCR   CBC/DIFF    Narrative:     The following orders were created for panel order CBC/DIFF.  Procedure                               Abnormality         Status                     ---------                               -----------         ------  CBC WITH UJWJ[[id number]]                                                                 Please view results for these tests on the individual orders.   COMPREHENSIVE METABOLIC PANEL, NON-FASTING   LACTIC ACID LEVEL W/ REFLEX FOR LEVEL >2.0   CBC WITH DIFF     No orders to display     Medical Decision Making   Diff dx rsv covid flu uri pneumonia pe sepsis dehydration.    2259 care left to dr.Josi Roediger.      Medications Administered in the ED   albuterol 90 mcg per inhalation oral inhaler - "Respiratory to administer" (2 Puffs Inhalation Given 01/25/23 2232)   ibuprofen (MOTRIN) tablet (has no administration in time range)   NS bolus infusion 1,000 mL (has no administration in time range)   NS bolus infusion 1,000 mL (has no administration in time range)   dexAMETHasone (PF) 10 mg/mL injection (has no administration in time range)   cefTRIAXone (ROCEPHIN) 1 g in NS 50 mL IVPB minibag (has no administration in time  range)   aerochamber w/ flowsignal spacer inhalational device (has no administration in time range)     Clinical Impression   Acute bronchitis, unspecified organism (Primary)   Fever, unspecified fever cause   Viral syndrome       Disposition: Transfered to Another Facility

## 2023-01-25 NOTE — ED Triage Notes (Addendum)
Pt c/o cough, fever, sore throat, and shortness of breath x 3 days. Pt states she last took Tylenol around 1400.

## 2023-01-25 NOTE — ED Nurses Note (Signed)
Pt c/o fever, cough, sore throat and shortness of breath for last 3 days. Pt appears uncomfortable. Pt states sx continue to worsen. Bilat expiratory wheezing noted. No s/s of acute distress noted at this time.

## 2023-01-26 LAB — URINALYSIS, MICROSCOPIC: BACTERIA: NEGATIVE /hpf

## 2023-01-26 MED ORDER — PREDNISONE 50 MG TABLET
50.0000 mg | ORAL_TABLET | Freq: Every day | ORAL | 0 refills | Status: AC
Start: 2023-01-26 — End: 2023-01-31

## 2023-01-26 MED ORDER — ACETAMINOPHEN 325 MG TABLET
975.0000 mg | ORAL_TABLET | ORAL | Status: AC
Start: 2023-01-26 — End: 2023-01-26
  Administered 2023-01-26: 975 mg via ORAL

## 2023-01-26 MED ORDER — IPRATROPIUM 0.5 MG-ALBUTEROL 3 MG (2.5 MG BASE)/3 ML NEBULIZATION SOLN
INHALATION_SOLUTION | RESPIRATORY_TRACT | Status: AC
Start: 2023-01-26 — End: 2023-01-26
  Filled 2023-01-26: qty 3

## 2023-01-26 MED ORDER — IPRATROPIUM 0.5 MG-ALBUTEROL 3 MG (2.5 MG BASE)/3 ML NEBULIZATION SOLN
3.0000 mL | INHALATION_SOLUTION | RESPIRATORY_TRACT | Status: AC
Start: 2023-01-26 — End: 2023-01-26
  Administered 2023-01-26: 3 mL via RESPIRATORY_TRACT

## 2023-01-26 MED ORDER — BENZONATATE 100 MG CAPSULE
100.0000 mg | ORAL_CAPSULE | Freq: Three times a day (TID) | ORAL | 0 refills | Status: AC | PRN
Start: 2023-01-26 — End: ?

## 2023-01-26 MED ORDER — ACETAMINOPHEN 325 MG TABLET
ORAL_TABLET | ORAL | Status: AC
Start: 2023-01-26 — End: 2023-01-26
  Filled 2023-01-26: qty 3

## 2023-01-26 MED ORDER — AZITHROMYCIN 250 MG TABLET
ORAL_TABLET | ORAL | 0 refills | Status: DC
Start: 2023-01-26 — End: 2023-08-14

## 2023-01-26 NOTE — ED Nurses Note (Signed)
Pt verbalized understanding to discharge instructions. Denies any questions or concerns at this time. Respirations even and unlabored w/ no s/s of acute distress noted at this time. Pt does report she does feel some better and shortness of breath has improved since neb.

## 2023-01-26 NOTE — ED Nurses Note (Signed)
Patient discharged home with family.  AVS reviewed with patient/care giver.  A written copy of the AVS and discharge instructions was given to the patient/care giver. Scripts sent to pharmacy on file. Questions sufficiently answered as needed.  Patient/care giver encouraged to follow up with PCP as indicated.  In the event of an emergency, patient/care giver instructed to call 911 or go to the nearest emergency room.

## 2023-01-26 NOTE — Discharge Instructions (Signed)
Follow-up with your physician if symptoms do not improve encourage p.o. fluids for the next 48 hours

## 2023-01-28 LAB — THROAT CULTURE, BETA HEMOLYTIC STREPTOCOCCUS: THROAT CULTURE: NORMAL

## 2023-01-31 LAB — ADULT ROUTINE BLOOD CULTURE, SET OF 2 BOTTLES (BACTERIA AND YEAST): BLOOD CULTURE, ROUTINE: NO GROWTH

## 2023-08-14 ENCOUNTER — Encounter (HOSPITAL_BASED_OUTPATIENT_CLINIC_OR_DEPARTMENT_OTHER): Payer: Self-pay

## 2023-08-14 ENCOUNTER — Other Ambulatory Visit: Payer: Self-pay

## 2023-08-14 ENCOUNTER — Emergency Department
Admission: EM | Admit: 2023-08-14 | Discharge: 2023-08-14 | Disposition: A | Discharge status: Home / Self Care | Payer: MEDICAID | Attending: Family | Admitting: Family

## 2023-08-14 ENCOUNTER — Emergency Department (HOSPITAL_BASED_OUTPATIENT_CLINIC_OR_DEPARTMENT_OTHER): Payer: MEDICAID

## 2023-08-14 DIAGNOSIS — R0789 Other chest pain: Secondary | ICD-10-CM | POA: Insufficient documentation

## 2023-08-14 DIAGNOSIS — R0600 Dyspnea, unspecified: Secondary | ICD-10-CM | POA: Insufficient documentation

## 2023-08-14 DIAGNOSIS — R739 Hyperglycemia, unspecified: Secondary | ICD-10-CM | POA: Insufficient documentation

## 2023-08-14 DIAGNOSIS — R079 Chest pain, unspecified: Secondary | ICD-10-CM

## 2023-08-14 DIAGNOSIS — R509 Fever, unspecified: Secondary | ICD-10-CM | POA: Insufficient documentation

## 2023-08-14 DIAGNOSIS — N390 Urinary tract infection, site not specified: Secondary | ICD-10-CM | POA: Insufficient documentation

## 2023-08-14 DIAGNOSIS — Z1152 Encounter for screening for COVID-19: Secondary | ICD-10-CM | POA: Insufficient documentation

## 2023-08-14 HISTORY — DX: Essential (primary) hypertension: I10

## 2023-08-14 LAB — COMPREHENSIVE METABOLIC PANEL, NON-FASTING
ALBUMIN/GLOBULIN RATIO: 0.9 (ref 0.8–1.4)
ALBUMIN: 3.2 g/dL — ABNORMAL LOW (ref 3.4–5.0)
ALKALINE PHOSPHATASE: 80 U/L (ref 46–116)
ALT (SGPT): 29 U/L (ref <=78)
ANION GAP: 10 mmol/L (ref 4–13)
AST (SGOT): 33 U/L (ref 15–37)
BILIRUBIN TOTAL: 0.6 mg/dL (ref 0.2–1.0)
BUN/CREA RATIO: 4
BUN: 5 mg/dL — ABNORMAL LOW (ref 7–18)
CALCIUM, CORRECTED: 9.1 mg/dL
CALCIUM: 8.5 mg/dL (ref 8.5–10.1)
CHLORIDE: 96 mmol/L — ABNORMAL LOW (ref 98–107)
CO2 TOTAL: 26 mmol/L (ref 21–32)
CREATININE: 1.23 mg/dL — ABNORMAL HIGH (ref 0.55–1.02)
ESTIMATED GFR: 55 mL/min/{1.73_m2} — ABNORMAL LOW (ref >59)
GLOBULIN: 3.6
GLUCOSE: 214 mg/dL — ABNORMAL HIGH (ref 74–106)
OSMOLALITY, CALCULATED: 268 mosm/kg — ABNORMAL LOW (ref 270–290)
POTASSIUM: 3.7 mmol/L (ref 3.5–5.1)
PROTEIN TOTAL: 6.8 g/dL (ref 6.4–8.2)
SODIUM: 132 mmol/L — ABNORMAL LOW (ref 136–145)

## 2023-08-14 LAB — PTT (PARTIAL THROMBOPLASTIN TIME): APTT: 25.2 s (ref 25.0–38.0)

## 2023-08-14 LAB — COVID-19, FLU A/B, RSV RAPID BY PCR
INFLUENZA VIRUS TYPE A: NOT DETECTED
INFLUENZA VIRUS TYPE B: NOT DETECTED
RESPIRATORY SYNCTIAL VIRUS (RSV): NOT DETECTED
SARS-CoV-2: NOT DETECTED

## 2023-08-14 LAB — ECG 12 LEAD
Atrial Rate: 89 {beats}/min
Calculated P Axis: 77 degrees
Calculated R Axis: -24 degrees
Calculated T Axis: 80 degrees
PR Interval: 132 ms
QRS Duration: 90 ms
QT Interval: 342 ms
QTC Calculation: 416 ms
Ventricular rate: 89 {beats}/min

## 2023-08-14 LAB — CBC WITH DIFF
BASOPHIL #: 0.03 10*3/uL (ref 0.00–0.30)
BASOPHIL %: 0 % (ref 0–3)
EOSINOPHIL #: 0.07 10*3/uL (ref 0.00–0.80)
EOSINOPHIL %: 1 % (ref 1–7)
HCT: 39 % (ref 37.0–47.0)
HGB: 12.6 g/dL (ref 12.5–16.0)
LYMPHOCYTE #: 1.35 10*3/uL (ref 1.10–5.00)
LYMPHOCYTE %: 13 % — ABNORMAL LOW (ref 25–45)
MCH: 30.9 pg (ref 27.0–32.0)
MCHC: 32.3 g/dL (ref 32.0–36.0)
MCV: 95.7 fL (ref 78.0–99.0)
MONOCYTE #: 0.61 10*3/uL (ref 0.00–1.30)
MONOCYTE %: 6 % (ref 0–12)
MPV: 9.3 fL (ref 7.4–10.4)
NEUTROPHIL #: 8.02 10*3/uL (ref 1.80–8.40)
NEUTROPHIL %: 80 % — ABNORMAL HIGH (ref 40–76)
PLATELETS: 179 10*3/uL (ref 140–440)
RBC: 4.08 10*6/uL — ABNORMAL LOW (ref 4.20–5.40)
RDW: 14.3 % (ref 11.6–14.8)
WBC: 10.1 10*3/uL (ref 4.0–10.5)

## 2023-08-14 LAB — URINALYSIS, MICROSCOPIC: WBCS: >50 /[HPF] — AB

## 2023-08-14 LAB — POC BLOOD GLUCOSE (RESULTS): GLUCOSE, POC: 194 mg/dL — ABNORMAL HIGH (ref 70–100)

## 2023-08-14 LAB — URINALYSIS, MACRO/MICRO
BILIRUBIN: NEGATIVE mg/dL
GLUCOSE: NEGATIVE mg/dL
KETONES: NEGATIVE mg/dL
NITRITE: NEGATIVE
PH: 6.5 (ref 4.6–8.0)
SPECIFIC GRAVITY: <=1.005 (ref 1.003–1.035)
UROBILINOGEN: 0.2 mg/dL (ref 0.2–1.0)

## 2023-08-14 LAB — TROPONIN-I
TROPONIN I: 4 ng/L (ref <15)
TROPONIN I: 4 ng/L (ref <15)
TROPONIN I: 4 ng/L (ref <15)

## 2023-08-14 LAB — D-DIMER: D-DIMER: 866 ng{FEU}/mL (ref 215–500)

## 2023-08-14 LAB — HCG QUALITATIVE PREGNANCY, SERUM: PREGNANCY, SERUM QUALITATIVE: NEGATIVE

## 2023-08-14 LAB — LACTIC ACID LEVEL W/ REFLEX FOR LEVEL >2.0: LACTIC ACID: 1.5 mmol/L (ref 0.4–2.0)

## 2023-08-14 MED ORDER — PROCHLORPERAZINE EDISYLATE 10 MG/2 ML (5 MG/ML) INJECTION SOLUTION
5.0000 mg | INTRAMUSCULAR | Status: AC
Start: 2023-08-14 — End: 2023-08-14
  Administered 2023-08-14: 5 mg via INTRAVENOUS

## 2023-08-14 MED ORDER — MORPHINE 4 MG/ML INJECTION WRAPPER
INJECTION | INTRAMUSCULAR | Status: AC
Start: 2023-08-14 — End: 2023-08-14
  Filled 2023-08-14: qty 1

## 2023-08-14 MED ORDER — SODIUM CHLORIDE 0.9 % (FLUSH) INJECTION SYRINGE
3.0000 mL | INJECTION | Freq: Three times a day (TID) | INTRAMUSCULAR | Status: DC
Start: 2023-08-14 — End: 2023-08-14
  Administered 2023-08-14: 0 mL

## 2023-08-14 MED ORDER — IOHEXOL 350 MG IODINE/ML INTRAVENOUS SOLUTION
100.0000 mL | INTRAVENOUS | Status: AC
Start: 2023-08-14 — End: 2023-08-14
  Administered 2023-08-14: 100 mL via INTRAVENOUS

## 2023-08-14 MED ORDER — CEFTRIAXONE 2 GRAM SOLUTION FOR INJECTION
INTRAMUSCULAR | Status: AC
Start: 2023-08-14 — End: 2023-08-14
  Filled 2023-08-14: qty 20

## 2023-08-14 MED ORDER — SODIUM CHLORIDE 0.9 % INTRAVENOUS PIGGYBACK
2.0000 g | INTRAVENOUS | Status: AC
Start: 2023-08-14 — End: 2023-08-14
  Administered 2023-08-14: 2 g via INTRAVENOUS
  Administered 2023-08-14: 0 g via INTRAVENOUS

## 2023-08-14 MED ORDER — SODIUM CHLORIDE 0.9 % INTRAVENOUS PIGGYBACK
INJECTION | INTRAVENOUS | Status: AC
Start: 2023-08-14 — End: 2023-08-14
  Filled 2023-08-14: qty 50

## 2023-08-14 MED ORDER — ACETAMINOPHEN 1,000 MG/100 ML (10 MG/ML) INTRAVENOUS SOLUTION
1000.0000 mg | Freq: Four times a day (QID) | INTRAVENOUS | Status: DC | PRN
Start: 2023-08-14 — End: 2023-08-14
  Administered 2023-08-14: 1000 mg via INTRAVENOUS
  Administered 2023-08-14: 0 mg via INTRAVENOUS

## 2023-08-14 MED ORDER — ONDANSETRON HCL (PF) 4 MG/2 ML INJECTION SOLUTION
4.0000 mg | INTRAMUSCULAR | Status: AC
Start: 2023-08-14 — End: 2023-08-14
  Administered 2023-08-14: 4 mg via INTRAVENOUS

## 2023-08-14 MED ORDER — SODIUM CHLORIDE 0.9 % IV BOLUS
1000.0000 mL | INJECTION | Status: AC
Start: 2023-08-14 — End: 2023-08-14
  Administered 2023-08-14: 0 mL via INTRAVENOUS
  Administered 2023-08-14: 1000 mL via INTRAVENOUS

## 2023-08-14 MED ORDER — CEFUROXIME AXETIL 500 MG TABLET
500.0000 mg | ORAL_TABLET | Freq: Two times a day (BID) | ORAL | 0 refills | Status: DC
Start: 2023-08-14 — End: 2023-08-24

## 2023-08-14 MED ORDER — PROCHLORPERAZINE EDISYLATE 10 MG/2 ML (5 MG/ML) INJECTION SOLUTION
INTRAMUSCULAR | Status: AC
Start: 2023-08-14 — End: 2023-08-14
  Filled 2023-08-14: qty 2

## 2023-08-14 MED ORDER — MORPHINE 4 MG/ML INJECTION WRAPPER
4.0000 mg | INJECTION | INTRAMUSCULAR | Status: AC
Start: 2023-08-14 — End: 2023-08-14
  Administered 2023-08-14: 4 mg via INTRAVENOUS

## 2023-08-14 MED ORDER — KETOROLAC 10 MG TABLET
10.0000 mg | ORAL_TABLET | Freq: Four times a day (QID) | ORAL | 0 refills | Status: AC | PRN
Start: 2023-08-14 — End: ?

## 2023-08-14 MED ORDER — ONDANSETRON 4 MG DISINTEGRATING TABLET
4.0000 mg | ORAL_TABLET | Freq: Three times a day (TID) | ORAL | 0 refills | Status: AC | PRN
Start: 2023-08-14 — End: ?

## 2023-08-14 MED ORDER — ASPIRIN 81 MG CHEWABLE TABLET
CHEWABLE_TABLET | ORAL | Status: AC
Start: 2023-08-14 — End: 2023-08-14
  Filled 2023-08-14: qty 4

## 2023-08-14 MED ORDER — METHYLPREDNISOLONE SOD SUCC 125 MG SOLUTION FOR INJECTION WRAPPER
INTRAVENOUS | Status: AC
Start: 2023-08-14 — End: 2023-08-14
  Filled 2023-08-14: qty 2

## 2023-08-14 MED ORDER — ACETAMINOPHEN 1,000 MG/100 ML (10 MG/ML) INTRAVENOUS SOLUTION
INTRAVENOUS | Status: AC
Start: 2023-08-14 — End: 2023-08-14
  Filled 2023-08-14: qty 100

## 2023-08-14 MED ORDER — METHYLPREDNISOLONE SOD SUCC 125 MG SOLUTION FOR INJECTION WRAPPER
125.0000 mg | INTRAVENOUS | Status: AC
Start: 2023-08-14 — End: 2023-08-14
  Administered 2023-08-14: 125 mg via INTRAVENOUS

## 2023-08-14 MED ORDER — SODIUM CHLORIDE 0.9 % (FLUSH) INJECTION SYRINGE
3.0000 mL | INJECTION | INTRAMUSCULAR | Status: DC | PRN
Start: 2023-08-14 — End: 2023-08-14

## 2023-08-14 MED ORDER — ONDANSETRON HCL (PF) 4 MG/2 ML INJECTION SOLUTION
INTRAMUSCULAR | Status: AC
Start: 2023-08-14 — End: 2023-08-14
  Filled 2023-08-14: qty 2

## 2023-08-14 MED ORDER — ASPIRIN 81 MG CHEWABLE TABLET
324.0000 mg | CHEWABLE_TABLET | ORAL | Status: AC
Start: 2023-08-14 — End: 2023-08-14
  Administered 2023-08-14: 324 mg via ORAL

## 2023-08-14 NOTE — ED Provider Notes (Signed)
[facility], [facility] - Emergency Department  ED Primary Provider Note  History of Present Illness   Chief Complaint   Patient presents with    Chest Pain      Arrival: The patient arrived by Car  [facility] is a 47 y.o. female who had concerns including Chest Pain . Pt states interm cp since last pm. Noted to  have a fever on arrival no reported cough congestion sob nvd abd pain nor other co.   Review of Systems   Constitutional: No fever, chills or weakness   Skin: No rash or diaphoresis  HENT: No headaches, or congestion  Eyes: No vision changes or photophobia   Cardio: +chest pain, no  palpitations or leg swelling   Respiratory: No cough, wheezing or SOB  GI:  No nausea, vomiting or stool changes  GU:  No dysuria, hematuria, or increased frequency  MSK: No muscle aches, joint or back pain  Neuro: No seizures, LOC, numbness, tingling, or focal weakness  Psychiatric: No depression, SI or substance abuse  All other systems reviewed and are negative.    History Reviewed This Encounter: all noted and reviewed.     Physical Exam   ED Triage Vitals [08/14/23 1356]   BP (Non-Invasive) 135/70   Heart Rate 93   Respiratory Rate (!) 21   Temperature (!) 39.1 C (102.3 F)   SpO2 95 %   Weight 76.7 kg (169 lb)   Height 1.575 m (5\' 2" )       Constitutional:  47 y.o. female who appears in no distress. Normal color, no cyanosis.   HENT:   Head: Normocephalic and atraumatic.   Mouth/Throat: Oropharynx is clear and moist.   Eyes: EOMI, PERRL   Neck: Trachea midline. Neck supple.  Cardiovascular: RRR, No murmurs, rubs or gallops. Intact distal pulses.  Pulmonary/Chest: BS equal bilaterally. No respiratory distress. No wheezes, rales or chest tenderness.   Abdominal: Bowel sounds present and normal. Abdomen soft, no tenderness, no rebound and no guarding.  Back: No midline spinal tenderness, no paraspinal tenderness, no CVA tenderness.           Musculoskeletal: No edema, tenderness or deformity.  Skin:  warm and dry. No rash, erythema, pallor or cyanosis  Psychiatric: normal mood and affect. Behavior is normal.   Neurological: Patient keenly alert and responsive, easily able to raise eyebrows, facial muscles/expressions symmetric, speaking in fluent sentences, moving all extremities equally and fully, normal gait  Patient Data     Labs Ordered/Reviewed   COMPREHENSIVE METABOLIC PANEL, NON-FASTING - Abnormal; Notable for the following components:       Result Value    SODIUM 132 (*)     CHLORIDE 96 (*)     BUN 5 (*)     CREATININE 1.23 (*)     ESTIMATED GFR 55 (*)     ALBUMIN 3.2 (*)     GLUCOSE 214 (*)     OSMOLALITY, CALCULATED 268 (*)     All other components within normal limits    Narrative:     Estimated Glomerular Filtration Rate (eGFR) is calculated using the CKD-EPI (2021) equation, intended for patients 32 years of age and older. If gender is not documented or "unknown", there will be no eGFR calculation.   D-DIMER - Abnormal; Notable for the following components:    D-DIMER 866 (*)     All other components within normal limits    Narrative:     D-Dimers are reported  in FEU per ng/mL.    IF PATIENT IS EXHIBITING SYMPTOMS DVT/PE, THIS D-DIMER RESULT MAY INDICATE A NEED FOR FURTHER TESTING FOR THESE CONDITIONS. IF PATIENT IS SUSPECTED OF DIC AND SYMPTOMS WORSEN OR PERSIST, A REPEAT DIC WORKUP SHOULD BE CONSIDERED.    NOTE: ALTHOUGH THE NORMAL RANGE FOR THIS TEST IS 215-500 ng/mL FEU, LITERATURE RECOMMENDS FURTHER TESTING FOR ANY RESULT >500ng/mL FEU.    A cut off value of 500ng/mL FEU or below can be used as an aid in the diagnosis of Thromboembolism when used in conjunction with the patient's medical history, clinical presentation and other findings. Results of 500ng/mL FEU or below have a negative predictive value of 100%.     CBC WITH DIFF - Abnormal; Notable for the following components:    RBC 4.08 (*)     NEUTROPHIL % 80 (*)     LYMPHOCYTE % 13 (*)     All other components within normal limits    URINALYSIS, MACRO/MICRO - Abnormal; Notable for the following components:    APPEARANCE Slightly Hazy (*)     LEUKOCYTES Large (*)     PROTEIN Trace (*)     BLOOD Small (*)     All other components within normal limits   URINALYSIS, MICROSCOPIC - Abnormal; Notable for the following components:    RBCS 6-10 (*)     BACTERIA Few (*)     WBCS >50 (*)     SQUAMOUS EPITHELIAL Several (*)     All other components within normal limits   POC BLOOD GLUCOSE (RESULTS) - Abnormal; Notable for the following components:    GLUCOSE, POC 194 (*)     All other components within normal limits   TROPONIN-I - Normal    Narrative:     Values received on females ranging between 12-15 ng/L MUST include the next serial troponin to review changes in the delta differences as the reference range for the Access II chemistry analyzer is lower than the established reference range.     TROPONIN-I - Normal    Narrative:     Values received on females ranging between 12-15 ng/L MUST include the next serial troponin to review changes in the delta differences as the reference range for the Access II chemistry analyzer is lower than the established reference range.     TROPONIN-I - Normal    Narrative:     Values received on females ranging between 12-15 ng/L MUST include the next serial troponin to review changes in the delta differences as the reference range for the Access II chemistry analyzer is lower than the established reference range.     PTT (PARTIAL THROMBOPLASTIN TIME) - Normal   HCG QUALITATIVE PREGNANCY, SERUM - Normal    Narrative:     [id number]/[id number]   COVID-19, FLU A/B, RSV RAPID BY PCR - Normal    Narrative:     Results are for the simultaneous qualitative identification of SARS-CoV-2 (formerly 2019-nCoV), Influenza A, Influenza B, and RSV RNA. These etiologic agents are generally detectable in nasopharyngeal and nasal swabs during the ACUTE PHASE of infection. Hence, this test is intended to be performed on respiratory specimens  collected from individuals with signs and symptoms of upper respiratory tract infection who meet Centers for Disease Control and Prevention (CDC) clinical and/or epidemiological criteria for Coronavirus Disease 2019 (COVID-19) testing. CDC COVID-19 criteria for testing on human specimens is available at [facility] webpage information for Healthcare Professionals: Coronavirus Disease 2019 (COVID-19) ([email or web address]).  False-negative results may occur if the virus has genomic mutations, insertions, deletions, or rearrangements or if performed very early in the course of illness. Otherwise, negative results indicate virus specific RNA targets are not detected, however negative results do not preclude SARS-CoV-2 infection/COVID-19, Influenza, or Respiratory syncytial virus infection. Results should not be used as the sole basis for patient management decisions. Negative results must be combined with clinical observations, patient history, and epidemiological information. If upper respiratory tract infection is still suspected based on exposure history together with other clinical findings, re-testing should be considered.    Test methodology:   Cepheid Xpert Xpress SARS-CoV-2/Flu/RSV Assay real-time polymerase chain reaction (RT-PCR) test on the GeneXpert Dx and Xpert Xpress systems.   LACTIC ACID LEVEL W/ REFLEX FOR LEVEL >2.0 - Normal   URINE CULTURE,ROUTINE   ADULT ROUTINE BLOOD CULTURE, SET OF 2 BOTTLES (BACTERIA AND YEAST)   ADULT ROUTINE BLOOD CULTURE, SET OF 2 BOTTLES (BACTERIA AND YEAST)   CBC/DIFF    Narrative:     The following orders were created for panel order CBC/DIFF.  Procedure                               Abnormality         Status                     ---------                               -----------         ------                     CBC WITH HYWV[[id number]]                Abnormal            Final result                 Please view results for these tests on  the individual orders.   URINALYSIS WITH REFLEX MICROSCOPIC AND CULTURE IF POSITIVE    Narrative:     The following orders were created for panel order URINALYSIS WITH REFLEX MICROSCOPIC AND CULTURE IF POSITIVE.  Procedure                               Abnormality         Status                     ---------                               -----------         ------                     URINALYSIS, MACRO/MICRO[[id number]]      Abnormal            Final result               URINALYSIS, MICROSCOPIC[[id number]]      Abnormal            Final result                 Please view results for these  tests on the individual orders.     CT ANGIO CHEST W IV CONTRAST   Final Result by Edi, Radresults In (11/14 1614)   NO ACUTE FINDINGS         One or more dose reduction techniques were used (e.g., Automated exposure control, adjustment of the mA and/or kV according to patient size, use of iterative reconstruction technique).         Radiologist location ID: [id number]         XR CHEST AP   Final Result by Edi, Radresults In (11/14 1420)   NO ACUTE FINDINGS.            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 Ct neg. Ua shows infection . Pt tolerated po fluids. Instructed the sugar is high here twice encouraged follow up on that and cardio fu for cp.   ED Course as of 08/14/23 2354   Thu Aug 14, 2023   1701 WBC: 10.1   1701 LEUKOCYTES(!): Large   1701 LACTIC ACID: 1.5            Medications Administered in the ED   aspirin chewable tablet 324 mg (324 mg Oral Given 08/14/23 1402)   methylPREDNISolone sod succ (SOLU-medrol) 125 mg/2 mL injection (125 mg Intravenous Given 08/14/23 1431)   NS bolus infusion 1,000 mL (0 mL Intravenous Stopped 08/14/23 1531)   cefTRIAXone (ROCEPHIN) 2 g in NS 50 mL IVPB minibag (0 g Intravenous Stopped 08/14/23 1621)   morphine 4 mg/mL injection (4 mg Intravenous Given 08/14/23 1551)   ondansetron (ZOFRAN) 2 mg/mL injection (4  mg Intravenous Given 08/14/23 1551)   iohexol (OMNIPAQUE 350) infusion (100 mL Intravenous Given 08/14/23 1603)   prochlorperazine (COMPAZINE) 5 mg/mL injection (5 mg Intravenous Given 08/14/23 1644)     Clinical Impression   Atypical chest pain (Primary)   Hyperglycemia   Dyspnea, unspecified type   UTI (urinary tract infection)   Fever, unspecified fever cause       Disposition: Discharged      I was physically present in the treatment area and immediately available for consultation during the period of this patients care. This does not imply that I physically saw and examined the patient or participated in the medical decision making at the time of service. I was not involved in the bedside history, physical, or evaluation.

## 2023-08-14 NOTE — ED Triage Notes (Signed)
Patient c/o chest pain that started last night. Patient states chest pain worse with movement. States pain feels like pressure and rates 5/10. Denies SOB and n/v/d.

## 2023-08-14 NOTE — ED Nurses Note (Signed)

## 2023-08-16 ENCOUNTER — Emergency Department
Admission: EM | Admit: 2023-08-16 | Discharge: 2023-08-16 | Disposition: A | Discharge status: Home / Self Care | Payer: MEDICAID | Source: Home / Self Care | Attending: Emergency Medicine | Admitting: Emergency Medicine

## 2023-08-16 ENCOUNTER — Emergency Department (HOSPITAL_BASED_OUTPATIENT_CLINIC_OR_DEPARTMENT_OTHER): Payer: MEDICAID

## 2023-08-16 ENCOUNTER — Encounter (HOSPITAL_BASED_OUTPATIENT_CLINIC_OR_DEPARTMENT_OTHER): Payer: Self-pay

## 2023-08-16 ENCOUNTER — Other Ambulatory Visit: Payer: Self-pay

## 2023-08-16 DIAGNOSIS — R0789 Other chest pain: Secondary | ICD-10-CM | POA: Insufficient documentation

## 2023-08-16 DIAGNOSIS — R079 Chest pain, unspecified: Secondary | ICD-10-CM

## 2023-08-16 LAB — CBC WITH DIFF
BASOPHIL #: 0.02 10*3/uL (ref 0.00–0.30)
BASOPHIL %: 0 % (ref 0–3)
EOSINOPHIL #: 0.17 10*3/uL (ref 0.00–0.80)
EOSINOPHIL %: 2 % (ref 1–7)
HCT: 33.5 % — ABNORMAL LOW (ref 37.0–47.0)
HGB: 11.2 g/dL — ABNORMAL LOW (ref 12.5–16.0)
LYMPHOCYTE #: 2.29 10*3/uL (ref 1.10–5.00)
LYMPHOCYTE %: 23 % — ABNORMAL LOW (ref 25–45)
MCH: 31.9 pg (ref 27.0–32.0)
MCHC: 33.5 g/dL (ref 32.0–36.0)
MCV: 95.3 fL (ref 78.0–99.0)
MONOCYTE #: 0.46 10*3/uL (ref 0.00–1.30)
MONOCYTE %: 5 % (ref 0–12)
MPV: 8.9 fL (ref 7.4–10.4)
NEUTROPHIL #: 6.88 10*3/uL (ref 1.80–8.40)
NEUTROPHIL %: 70 % (ref 40–76)
PLATELETS: 223 10*3/uL (ref 140–440)
RBC: 3.52 10*6/uL — ABNORMAL LOW (ref 4.20–5.40)
RDW: 14.3 % (ref 11.6–14.8)
WBC: 9.8 10*3/uL (ref 4.0–10.5)

## 2023-08-16 LAB — COMPREHENSIVE METABOLIC PANEL, NON-FASTING
ALBUMIN/GLOBULIN RATIO: 0.9 (ref 0.8–1.4)
ALBUMIN: 3 g/dL — ABNORMAL LOW (ref 3.4–5.0)
ALKALINE PHOSPHATASE: 76 U/L (ref 46–116)
ALT (SGPT): 36 U/L (ref <=78)
ANION GAP: 13 mmol/L (ref 4–13)
AST (SGOT): 44 U/L — ABNORMAL HIGH (ref 15–37)
BILIRUBIN TOTAL: 0.2 mg/dL (ref 0.2–1.0)
BUN/CREA RATIO: 8
BUN: 8 mg/dL (ref 7–18)
CALCIUM, CORRECTED: 9.4 mg/dL
CALCIUM: 8.6 mg/dL (ref 8.5–10.1)
CHLORIDE: 102 mmol/L (ref 98–107)
CO2 TOTAL: 25 mmol/L (ref 21–32)
CREATININE: 1.04 mg/dL — ABNORMAL HIGH (ref 0.55–1.02)
ESTIMATED GFR: 68 mL/min/{1.73_m2} (ref >59)
GLOBULIN: 3.4
GLUCOSE: 118 mg/dL — ABNORMAL HIGH (ref 74–106)
OSMOLALITY, CALCULATED: 279 mosm/kg (ref 270–290)
POTASSIUM: 3.8 mmol/L (ref 3.5–5.1)
PROTEIN TOTAL: 6.4 g/dL (ref 6.4–8.2)
SODIUM: 140 mmol/L (ref 136–145)

## 2023-08-16 LAB — ECG 12 LEAD
Atrial Rate: 73 {beats}/min
Calculated P Axis: 59 degrees
Calculated R Axis: 38 degrees
Calculated T Axis: 31 degrees
PR Interval: 136 ms
QRS Duration: 78 ms
QT Interval: 408 ms
QTC Calculation: 449 ms
Ventricular rate: 73 {beats}/min

## 2023-08-16 LAB — D-DIMER: D-DIMER: 743 ng{FEU}/mL (ref 215–500)

## 2023-08-16 LAB — TROPONIN-I
TROPONIN I: 4 ng/L (ref <15)
TROPONIN I: 4 ng/L (ref <15)

## 2023-08-16 MED ORDER — ASPIRIN 81 MG CHEWABLE TABLET
CHEWABLE_TABLET | ORAL | Status: AC
Start: 2023-08-16 — End: 2023-08-16
  Filled 2023-08-16: qty 4

## 2023-08-16 MED ORDER — ALUMINUM-MAG HYDROXIDE-SIMETHICONE 200 MG-200 MG-20 MG/5 ML ORAL SUSP
30.0000 mL | Freq: Once | ORAL | Status: AC
Start: 2023-08-16 — End: 2023-08-16
  Administered 2023-08-16: 30 mL via ORAL

## 2023-08-16 MED ORDER — PANTOPRAZOLE 20 MG TABLET,DELAYED RELEASE
20.0000 mg | DELAYED_RELEASE_TABLET | Freq: Every morning | ORAL | 0 refills | Status: AC
Start: 2023-08-16 — End: 2023-08-30

## 2023-08-16 MED ORDER — ASPIRIN 81 MG CHEWABLE TABLET
324.0000 mg | CHEWABLE_TABLET | ORAL | Status: AC
Start: 2023-08-16 — End: 2023-08-16
  Administered 2023-08-16: 324 mg via ORAL

## 2023-08-16 MED ORDER — HYOSCYAMINE 0.125 MG/5 ML ORAL ELIXIR
ORAL_SOLUTION | ORAL | Status: AC
Start: 2023-08-16 — End: 2023-08-16
  Filled 2023-08-16: qty 10

## 2023-08-16 MED ORDER — HYOSCYAMINE 0.125 MG/5 ML ORAL ELIXIR
10.0000 mL | ORAL_SOLUTION | Freq: Once | ORAL | Status: AC
Start: 2023-08-16 — End: 2023-08-16
  Administered 2023-08-16: 10 mL via ORAL

## 2023-08-16 MED ORDER — NITROGLYCERIN 0.4 MG SUBLINGUAL TABLET
0.4000 mg | SUBLINGUAL_TABLET | SUBLINGUAL | Status: DC | PRN
Start: 2023-08-16 — End: 2023-08-16
  Administered 2023-08-16 (×2): 0.4 mg via SUBLINGUAL

## 2023-08-16 MED ORDER — ALUMINUM-MAG HYDROXIDE-SIMETHICONE 200 MG-200 MG-20 MG/5 ML ORAL SUSP
ORAL | Status: AC
Start: 2023-08-16 — End: 2023-08-16
  Filled 2023-08-16: qty 30

## 2023-08-16 MED ORDER — PANTOPRAZOLE 40 MG TABLET,DELAYED RELEASE
40.0000 mg | DELAYED_RELEASE_TABLET | ORAL | Status: AC
Start: 2023-08-16 — End: 2023-08-16
  Administered 2023-08-16: 40 mg via ORAL

## 2023-08-16 MED ORDER — NITROGLYCERIN 0.4 MG SUBLINGUAL TABLET
SUBLINGUAL_TABLET | SUBLINGUAL | Status: AC
Start: 2023-08-16 — End: 2023-08-16
  Filled 2023-08-16: qty 1

## 2023-08-16 MED ORDER — PANTOPRAZOLE 40 MG TABLET,DELAYED RELEASE
DELAYED_RELEASE_TABLET | ORAL | Status: AC
Start: 2023-08-16 — End: 2023-08-16
  Filled 2023-08-16: qty 1

## 2023-08-16 NOTE — ED Provider Notes (Signed)
[facility], [facility] - Emergency Department  ED Primary Provider Note  HPI:  Sydney Mullins is a 47 y.o. female     Patient complains of chest pressure which has been ongoing about 4-5 days.  It is localized across the upper part of the chest.  Constant no inciting exacerbating or alleviating factors.  There was no exertional component or pleuritic component.  She rates it about a 6/10 currently.  Cardiac risk factors include hypertension and family history.  Denies diabetes smoking.  Denies any prior cardiac workup.  Chart review indicates patient was seen here 2 days ago for similar complaints.  Cardiac workup was negative at that time.  Patient did have a CT angio which showed no evidence of pneumonia or pulmonary embolism.  Incidentally found to have a bacteriuria and discharged with antibiotic.  She states she has been compliant with the antibiotic.  She has yet to follow up with Cardiology..  Patient is full code.    ROS review and negative aside from stated in HPI.    Physical Exam:  ED Triage Vitals [08/16/23 0424]   BP (Non-Invasive) 109/70   Heart Rate 73   Respiratory Rate (!) 21   Temperature 36.6 C (97.8 F)   SpO2 95 %   Weight 76.7 kg (169 lb)   Height 1.575 m (5\' 2" )       No acute distress.  Patient awake alert oriented x3.  Mood is appropriate.  Pupils 3 mm equal round reactive.  Extraocular movements are intact.  Oropharynx is clear.  Mucous membranes moist.  Trachea midline.  Neck is supple.  Heart has regular rate and rhythm without significant murmurs rubs or gallops.  Lungs are clear to auscultation.  Abdomen soft nontender, nondistended.  Moving all extremities without difficulty.  No rash no edema.        Patient data:  Labs Ordered/Reviewed   COMPREHENSIVE METABOLIC PANEL, NON-FASTING - Abnormal; Notable for the following components:       Result Value    CREATININE 1.04 (*)     ALBUMIN 3.0 (*)     GLUCOSE 118 (*)     AST (SGOT) 44 (*)     All other components within  normal limits    Narrative:     Estimated Glomerular Filtration Rate (eGFR) is calculated using the CKD-EPI (2021) equation, intended for patients 66 years of age and older. If gender is not documented or "unknown", there will be no eGFR calculation.   D-DIMER - Abnormal; Notable for the following components:    D-DIMER 743 (*)     All other components within normal limits    Narrative:     D-Dimers are reported in FEU per ng/mL.    IF PATIENT IS EXHIBITING SYMPTOMS DVT/PE, THIS D-DIMER RESULT MAY INDICATE A NEED FOR FURTHER TESTING FOR THESE CONDITIONS. IF PATIENT IS SUSPECTED OF DIC AND SYMPTOMS WORSEN OR PERSIST, A REPEAT DIC WORKUP SHOULD BE CONSIDERED.    NOTE: ALTHOUGH THE NORMAL RANGE FOR THIS TEST IS 215-500 ng/mL FEU, LITERATURE RECOMMENDS FURTHER TESTING FOR ANY RESULT >500ng/mL FEU.    A cut off value of 500ng/mL FEU or below can be used as an aid in the diagnosis of Thromboembolism when used in conjunction with the patient's medical history, clinical presentation and other findings. Results of 500ng/mL FEU or below have a negative predictive value of 100%.     CBC WITH DIFF - Abnormal; Notable for the following components:    RBC  3.52 (*)     HGB 11.2 (*)     HCT 33.5 (*)     LYMPHOCYTE % 23 (*)     All other components within normal limits   TROPONIN-I - Normal    Narrative:     Values received on females ranging between 12-15 ng/L MUST include the next serial troponin to review changes in the delta differences as the reference range for the Access II chemistry analyzer is lower than the established reference range.     TROPONIN-I - Normal    Narrative:     Values received on females ranging between 12-15 ng/L MUST include the next serial troponin to review changes in the delta differences as the reference range for the Access II chemistry analyzer is lower than the established reference range.     CBC/DIFF    Narrative:     The following orders were created for panel order CBC/DIFF.  Procedure                                Abnormality         Status                     ---------                               -----------         ------                     CBC WITH ZOXW[[id number]]                Abnormal            Final result                 Please view results for these tests on the individual orders.     XR AP MOBILE CHEST   Final Result by Edi, Radresults In (11/16 0715)   NO ACUTE FINDINGS.            Radiologist location ID: [id number]           EKG read by me shows a sinus rhythm with a rate of 73, normal axis, QTC 449.  Do not appreciate significant ST or T-wave changes.  MDM:  Chest pressure.  Will rule out acute coronary syndrome, arrhythmia pneumonia.  Gastroesophageal reflux disease anxiety may be contributors.  Patient had a negative CT angio here just 2 days ago making PE unlikely.  Dimer ordered today is essentially unchanged from prior.  Serial troponins x2 within normal limits.  Remainder of lab work grossly unremarkable.  Hemoglobin of 11.2 noted she denies any signs or symptoms of GI bleed.  Patient received aspirin prior to arrival.  She reported some improvement with a GI cocktail and Protonix.  Nitro was not particularly helpful.  Patient resting comfortably on re-evaluation.  She is still complains of some chest discomfort but seen playing on phone and seems unbothered.  Patient's heart score is 2 stratify the patient is low risk with less than 2% chance of 30 day Mace.  Recommended cardiology follow up.  Will be discharged with trial of Protonix.  I gave strict return to ED instructions in both a verbal and written manner.  Patient is comfortable with discharge  .  Discharged  Clinical Impression   Chest pressure (Primary)     Medications Administered in the ED   nitroGLYCERIN (NITROSTAT) sublingual tablet (0.4 mg Sublingual Given 08/16/23 0547)   aspirin chewable tablet 324 mg (324 mg Oral Given 08/16/23 0437)   aluminum-magnesium hydroxide-simethicone (MAG-AL PLUS) 200-200-20 mg  per 5 mL oral liquid (30 mL Oral Given 08/16/23 0612)     And   hyoscyamine (HYOSYNE) 0.125 mg per 5 mL oral liquid (10 mL Oral Given 08/16/23 0612)   pantoprazole (PROTONIX) delayed release tablet (40 mg Oral Given 08/16/23 0612)        Current Discharge Medication List        START taking these medications.        Details   pantoprazole 20 mg Tablet, Delayed Release (E.C.)  Commonly known as: PROTONIX   20 mg, Oral, EVERY MORNING BEFORE BREAKFAST  Qty: 14 Tablet  Refills: 0            CONTINUE these medications - NO CHANGES were made during your visit.        Details   amitriptyline 25 mg Tablet  Commonly known as: ELAVIL   25 mg, Oral, NIGHTLY  Refills: 0     benzonatate 100 mg Capsule  Commonly known as: TESSALON   100 mg, Oral, EVERY 8 HOURS PRN  Qty: 12 Capsule  Refills: 0     Butrans 5 mcg/hour Patch Weekly  Generic drug: buprenorphine   1 Patch, Transdermal, EVERY 7 DAYS  Refills: 0     cefuroxime 500 mg Tablet  Commonly known as: CEFTIN   500 mg, Oral, 2 TIMES DAILY  Qty: 20 Tablet  Refills: 0     gabapentin 400 mg Capsule  Commonly known as: NEURONTIN   600 mg, Oral, 3 TIMES DAILY  Refills: 0     Ibuprofen 800 mg Tablet  Commonly known as: MOTRIN   800 mg, Oral, 3 TIMES DAILY PRN  Refills: 0     ketorolac tromethamine 10 mg Tablet  Commonly known as: TORADOL   10 mg, Oral, EVERY 6 HOURS PRN  Qty: 12 Tablet  Refills: 0     ondansetron 4 mg Tablet, Rapid Dissolve  Commonly known as: ZOFRAN ODT   4 mg, Oral, EVERY 8 HOURS PRN  Qty: 12 Tablet  Refills: 0     propranoloL 20 mg Tablet  Commonly known as: INDERAL   20 mg, Oral, 3 TIMES DAILY  Refills: 0     tiZANidine 4 mg Tablet  Commonly known as: ZANAFLEX   4 mg, Oral, 3 TIMES DAILY  Refills: 0

## 2023-08-16 NOTE — ED Triage Notes (Signed)
Patient c/o chest pain for the past few days. Describes pain as generalized and as pressure. States that she has been vomiting blood-streaked emesis tonight.

## 2023-08-16 NOTE — ED Nurses Note (Signed)
Pt c/o chest pain that radiates left to right shoulders that began around 1400 yesterday. Pt c/o nausea/vomiting, took Zofran PTA. Pt denies cardiac hx. C/o shortness of breath on exertion. Pt placed on monitor. No s/s of acute distress noted at this time.

## 2023-08-16 NOTE — ED Nurses Note (Signed)
Patient discharged home with family.  AVS reviewed with patient/care giver.  A written copy of the AVS and discharge instructions was given to the patient/care giver. Script escribed to preferred pharmacy. Questions sufficiently answered as needed.  Patient/care giver encouraged to follow up with PCP as indicated.  In the event of an emergency, patient/care giver instructed to call 911 or go to the nearest emergency room.

## 2023-08-16 NOTE — Discharge Instructions (Addendum)
Your evaluation today did not reveal critical condition requiring immediate hospitalization.  Trial the antacid prescribed.  It may assist with her symptoms.  Follow with primary care and Cardiology ideally within next 2-3 days.  Return to emergency department sooner if you have worsening chest pain shortness breath or any other concerning symptoms.  Thank you for visiting [place]

## 2023-08-17 ENCOUNTER — Other Ambulatory Visit (HOSPITAL_COMMUNITY): Payer: Self-pay | Admitting: NURSE PRACTITIONER

## 2023-08-17 LAB — URINE CULTURE,ROUTINE: URINE CULTURE: 50000 — AB

## 2023-08-17 MED ORDER — CEFDINIR 300 MG CAPSULE
300.0000 mg | ORAL_CAPSULE | Freq: Two times a day (BID) | ORAL | 0 refills | Status: AC
Start: 2023-08-17 — End: 2023-08-24

## 2023-08-17 NOTE — Result Encounter Note (Signed)
08/17/2023 1504 called and spoke with pt, notified her of urine culture report and need for new rx cefdinir. Pt notified to stop previous abx rx and start new rx abx. Pt verbalized understanding

## 2023-08-19 LAB — ADULT ROUTINE BLOOD CULTURE, SET OF 2 BOTTLES (BACTERIA AND YEAST)
BLOOD CULTURE, ROUTINE: NO GROWTH
BLOOD CULTURE, ROUTINE: NO GROWTH

## 2023-12-31 ENCOUNTER — Other Ambulatory Visit (HOSPITAL_COMMUNITY): Payer: Self-pay | Admitting: PHYSICIAN ASSISTANT

## 2024-01-06 ENCOUNTER — Encounter (HOSPITAL_COMMUNITY): Payer: Self-pay

## 2024-01-06 ENCOUNTER — Ambulatory Visit
Admission: RE | Admit: 2024-01-06 | Discharge: 2024-01-06 | Disposition: A | Discharge status: Home / Self Care | Payer: MEDICAID | Source: Ambulatory Visit | Attending: PHYSICIAN ASSISTANT | Admitting: PHYSICIAN ASSISTANT

## 2024-01-06 ENCOUNTER — Other Ambulatory Visit: Payer: Self-pay

## 2024-01-06 DIAGNOSIS — Z1231 Encounter for screening mammogram for malignant neoplasm of breast: Secondary | ICD-10-CM | POA: Insufficient documentation

## 2024-11-02 ENCOUNTER — Other Ambulatory Visit (HOSPITAL_COMMUNITY): Payer: Self-pay | Admitting: FAMILY PRACTICE

## 2024-11-02 ENCOUNTER — Encounter (HOSPITAL_COMMUNITY): Payer: Self-pay

## 2024-11-02 DIAGNOSIS — R000 Tachycardia, unspecified: Secondary | ICD-10-CM

## 2024-11-09 ENCOUNTER — Ambulatory Visit: Payer: MEDICAID
